# Patient Record
Sex: Female | Born: 1945 | Race: White | Hispanic: No | Marital: Married | Smoking: Never smoker
Health system: Southern US, Community
[De-identification: ages and names within clinical notes are randomized; demographics above are authoritative.]

## PROBLEM LIST (undated history)

## (undated) DIAGNOSIS — Z923 Personal history of irradiation: Secondary | ICD-10-CM

## (undated) DIAGNOSIS — C50919 Malignant neoplasm of unspecified site of unspecified female breast: Secondary | ICD-10-CM

## (undated) DIAGNOSIS — Z9889 Other specified postprocedural states: Secondary | ICD-10-CM

## (undated) DIAGNOSIS — R011 Cardiac murmur, unspecified: Secondary | ICD-10-CM

## (undated) DIAGNOSIS — K219 Gastro-esophageal reflux disease without esophagitis: Secondary | ICD-10-CM

## (undated) DIAGNOSIS — M8580 Other specified disorders of bone density and structure, unspecified site: Secondary | ICD-10-CM

## (undated) DIAGNOSIS — M1990 Unspecified osteoarthritis, unspecified site: Secondary | ICD-10-CM

## (undated) DIAGNOSIS — IMO0001 Reserved for inherently not codable concepts without codable children: Secondary | ICD-10-CM

## (undated) DIAGNOSIS — R112 Nausea with vomiting, unspecified: Secondary | ICD-10-CM

## (undated) DIAGNOSIS — E785 Hyperlipidemia, unspecified: Secondary | ICD-10-CM

## (undated) HISTORY — DX: Unspecified osteoarthritis, unspecified site: M19.90

## (undated) HISTORY — DX: Hyperlipidemia, unspecified: E78.5

## (undated) HISTORY — PX: BREAST BIOPSY: SHX20

## (undated) HISTORY — DX: Cardiac murmur, unspecified: R01.1

## (undated) HISTORY — DX: Reserved for inherently not codable concepts without codable children: IMO0001

## (undated) HISTORY — DX: Gastro-esophageal reflux disease without esophagitis: K21.9

## (undated) HISTORY — PX: COLONOSCOPY: SHX174

## (undated) HISTORY — DX: Other specified disorders of bone density and structure, unspecified site: M85.80

## (undated) HISTORY — PX: EYE SURGERY: SHX253

## (undated) HISTORY — PX: ENUCLEATION: SHX628

## (undated) HISTORY — PX: PARTIAL KNEE ARTHROPLASTY: SHX2174

---

## 1994-02-23 HISTORY — PX: BREAST LUMPECTOMY: SHX2

## 1999-11-19 ENCOUNTER — Encounter: Admission: RE | Admit: 1999-11-19 | Discharge: 1999-11-19 | Payer: Self-pay | Admitting: Surgery

## 1999-11-19 ENCOUNTER — Encounter: Payer: Self-pay | Admitting: Surgery

## 2000-01-28 ENCOUNTER — Other Ambulatory Visit: Admission: RE | Admit: 2000-01-28 | Discharge: 2000-01-28 | Payer: Self-pay | Admitting: Gynecology

## 2000-11-22 ENCOUNTER — Encounter: Admission: RE | Admit: 2000-11-22 | Discharge: 2000-11-22 | Payer: Self-pay | Admitting: Surgery

## 2000-11-22 ENCOUNTER — Encounter: Payer: Self-pay | Admitting: Surgery

## 2001-02-03 ENCOUNTER — Other Ambulatory Visit: Admission: RE | Admit: 2001-02-03 | Discharge: 2001-02-03 | Payer: Self-pay | Admitting: Gynecology

## 2001-11-24 ENCOUNTER — Encounter: Payer: Self-pay | Admitting: Surgery

## 2001-11-24 ENCOUNTER — Encounter: Admission: RE | Admit: 2001-11-24 | Discharge: 2001-11-24 | Payer: Self-pay | Admitting: Surgery

## 2002-02-07 ENCOUNTER — Other Ambulatory Visit: Admission: RE | Admit: 2002-02-07 | Discharge: 2002-02-07 | Payer: Self-pay | Admitting: Gynecology

## 2002-12-08 ENCOUNTER — Encounter: Admission: RE | Admit: 2002-12-08 | Discharge: 2002-12-08 | Payer: Self-pay | Admitting: Surgery

## 2002-12-08 ENCOUNTER — Encounter: Payer: Self-pay | Admitting: Surgery

## 2003-03-01 ENCOUNTER — Other Ambulatory Visit: Admission: RE | Admit: 2003-03-01 | Discharge: 2003-03-01 | Payer: Self-pay | Admitting: Gynecology

## 2003-12-10 ENCOUNTER — Encounter: Admission: RE | Admit: 2003-12-10 | Discharge: 2003-12-10 | Payer: Self-pay | Admitting: Surgery

## 2004-03-13 ENCOUNTER — Other Ambulatory Visit: Admission: RE | Admit: 2004-03-13 | Discharge: 2004-03-13 | Payer: Self-pay | Admitting: Gynecology

## 2004-12-30 ENCOUNTER — Encounter: Admission: RE | Admit: 2004-12-30 | Discharge: 2004-12-30 | Payer: Self-pay | Admitting: Surgery

## 2005-01-12 ENCOUNTER — Encounter (INDEPENDENT_AMBULATORY_CARE_PROVIDER_SITE_OTHER): Payer: Self-pay | Admitting: *Deleted

## 2005-01-12 ENCOUNTER — Encounter: Admission: RE | Admit: 2005-01-12 | Discharge: 2005-01-12 | Payer: Self-pay | Admitting: Surgery

## 2005-01-16 ENCOUNTER — Encounter: Admission: RE | Admit: 2005-01-16 | Discharge: 2005-01-16 | Payer: Self-pay | Admitting: Surgery

## 2005-01-20 ENCOUNTER — Encounter: Admission: RE | Admit: 2005-01-20 | Discharge: 2005-01-20 | Payer: Self-pay | Admitting: Surgery

## 2005-05-07 ENCOUNTER — Ambulatory Visit: Payer: Self-pay | Admitting: Internal Medicine

## 2005-05-21 ENCOUNTER — Ambulatory Visit: Payer: Self-pay | Admitting: Internal Medicine

## 2005-09-08 ENCOUNTER — Encounter: Admission: RE | Admit: 2005-09-08 | Discharge: 2005-09-08 | Payer: Self-pay | Admitting: Surgery

## 2005-12-31 ENCOUNTER — Encounter: Admission: RE | Admit: 2005-12-31 | Discharge: 2005-12-31 | Payer: Self-pay | Admitting: Surgery

## 2006-05-05 ENCOUNTER — Other Ambulatory Visit: Admission: RE | Admit: 2006-05-05 | Discharge: 2006-05-05 | Payer: Self-pay | Admitting: Gynecology

## 2006-08-31 ENCOUNTER — Encounter: Admission: RE | Admit: 2006-08-31 | Discharge: 2006-08-31 | Payer: Self-pay | Admitting: Surgery

## 2006-08-31 ENCOUNTER — Encounter (INDEPENDENT_AMBULATORY_CARE_PROVIDER_SITE_OTHER): Payer: Self-pay | Admitting: Diagnostic Radiology

## 2007-05-16 ENCOUNTER — Encounter: Admission: RE | Admit: 2007-05-16 | Discharge: 2007-05-16 | Payer: Self-pay | Admitting: Gynecology

## 2007-09-01 ENCOUNTER — Encounter: Admission: RE | Admit: 2007-09-01 | Discharge: 2007-09-01 | Payer: Self-pay | Admitting: Surgery

## 2007-09-08 ENCOUNTER — Encounter (INDEPENDENT_AMBULATORY_CARE_PROVIDER_SITE_OTHER): Payer: Self-pay | Admitting: Diagnostic Radiology

## 2007-09-08 ENCOUNTER — Encounter: Admission: RE | Admit: 2007-09-08 | Discharge: 2007-09-08 | Payer: Self-pay | Admitting: Surgery

## 2008-09-10 ENCOUNTER — Encounter: Admission: RE | Admit: 2008-09-10 | Discharge: 2008-09-10 | Payer: Self-pay | Admitting: Surgery

## 2009-02-23 HISTORY — PX: KNEE ARTHROSCOPY: SHX127

## 2009-02-28 ENCOUNTER — Observation Stay (HOSPITAL_COMMUNITY): Admission: RE | Admit: 2009-02-28 | Discharge: 2009-03-01 | Payer: Self-pay | Admitting: Orthopedic Surgery

## 2009-09-12 ENCOUNTER — Encounter: Admission: RE | Admit: 2009-09-12 | Discharge: 2009-09-12 | Payer: Self-pay | Admitting: Surgery

## 2010-07-09 ENCOUNTER — Other Ambulatory Visit: Payer: Self-pay | Admitting: Gynecology

## 2010-08-21 ENCOUNTER — Other Ambulatory Visit (INDEPENDENT_AMBULATORY_CARE_PROVIDER_SITE_OTHER): Payer: Self-pay | Admitting: Surgery

## 2010-08-21 DIAGNOSIS — Z1231 Encounter for screening mammogram for malignant neoplasm of breast: Secondary | ICD-10-CM

## 2010-09-15 ENCOUNTER — Ambulatory Visit
Admission: RE | Admit: 2010-09-15 | Discharge: 2010-09-15 | Disposition: A | Discharge status: Home / Self Care | Payer: Medicare Other | Source: Ambulatory Visit | Attending: Surgery | Admitting: Surgery

## 2010-09-15 DIAGNOSIS — Z1231 Encounter for screening mammogram for malignant neoplasm of breast: Secondary | ICD-10-CM

## 2011-07-21 DIAGNOSIS — E7800 Pure hypercholesterolemia, unspecified: Secondary | ICD-10-CM | POA: Diagnosis not present

## 2011-07-21 DIAGNOSIS — Z01419 Encounter for gynecological examination (general) (routine) without abnormal findings: Secondary | ICD-10-CM | POA: Diagnosis not present

## 2011-07-21 DIAGNOSIS — R7989 Other specified abnormal findings of blood chemistry: Secondary | ICD-10-CM | POA: Diagnosis not present

## 2011-08-24 ENCOUNTER — Other Ambulatory Visit (INDEPENDENT_AMBULATORY_CARE_PROVIDER_SITE_OTHER): Payer: Self-pay | Admitting: Surgery

## 2011-08-24 DIAGNOSIS — Z1231 Encounter for screening mammogram for malignant neoplasm of breast: Secondary | ICD-10-CM

## 2011-08-24 DIAGNOSIS — M25569 Pain in unspecified knee: Secondary | ICD-10-CM | POA: Diagnosis not present

## 2011-09-17 ENCOUNTER — Ambulatory Visit: Payer: Medicare Other

## 2011-09-25 ENCOUNTER — Ambulatory Visit: Payer: Medicare Other

## 2011-10-13 ENCOUNTER — Ambulatory Visit
Admission: RE | Admit: 2011-10-13 | Discharge: 2011-10-13 | Disposition: A | Discharge status: Home / Self Care | Payer: Medicare Other | Source: Ambulatory Visit | Attending: Surgery | Admitting: Surgery

## 2011-10-13 DIAGNOSIS — Z1231 Encounter for screening mammogram for malignant neoplasm of breast: Secondary | ICD-10-CM

## 2011-11-26 DIAGNOSIS — Z23 Encounter for immunization: Secondary | ICD-10-CM | POA: Diagnosis not present

## 2011-12-25 DIAGNOSIS — Z9001 Acquired absence of eye: Secondary | ICD-10-CM | POA: Diagnosis not present

## 2011-12-25 DIAGNOSIS — H52209 Unspecified astigmatism, unspecified eye: Secondary | ICD-10-CM | POA: Diagnosis not present

## 2012-04-15 DIAGNOSIS — R7989 Other specified abnormal findings of blood chemistry: Secondary | ICD-10-CM | POA: Diagnosis not present

## 2012-04-15 DIAGNOSIS — E7800 Pure hypercholesterolemia, unspecified: Secondary | ICD-10-CM | POA: Diagnosis not present

## 2012-06-16 DIAGNOSIS — K219 Gastro-esophageal reflux disease without esophagitis: Secondary | ICD-10-CM | POA: Diagnosis not present

## 2012-06-16 DIAGNOSIS — IMO0002 Reserved for concepts with insufficient information to code with codable children: Secondary | ICD-10-CM | POA: Diagnosis not present

## 2012-06-16 DIAGNOSIS — E785 Hyperlipidemia, unspecified: Secondary | ICD-10-CM | POA: Diagnosis not present

## 2012-06-16 DIAGNOSIS — Z1331 Encounter for screening for depression: Secondary | ICD-10-CM | POA: Diagnosis not present

## 2012-09-20 ENCOUNTER — Other Ambulatory Visit: Payer: Self-pay

## 2012-09-20 DIAGNOSIS — Z1231 Encounter for screening mammogram for malignant neoplasm of breast: Secondary | ICD-10-CM

## 2012-10-13 ENCOUNTER — Ambulatory Visit
Admission: RE | Admit: 2012-10-13 | Discharge: 2012-10-13 | Disposition: A | Discharge status: Home / Self Care | Payer: Medicare Other | Source: Ambulatory Visit

## 2012-10-13 DIAGNOSIS — Z1231 Encounter for screening mammogram for malignant neoplasm of breast: Secondary | ICD-10-CM | POA: Diagnosis not present

## 2012-11-07 DIAGNOSIS — Z23 Encounter for immunization: Secondary | ICD-10-CM | POA: Diagnosis not present

## 2012-12-13 ENCOUNTER — Encounter: Payer: Self-pay | Admitting: Podiatry

## 2012-12-13 ENCOUNTER — Ambulatory Visit (INDEPENDENT_AMBULATORY_CARE_PROVIDER_SITE_OTHER): Payer: Medicare Other | Admitting: Podiatry

## 2012-12-13 ENCOUNTER — Ambulatory Visit (INDEPENDENT_AMBULATORY_CARE_PROVIDER_SITE_OTHER): Payer: Medicare Other

## 2012-12-13 DIAGNOSIS — M2041 Other hammer toe(s) (acquired), right foot: Secondary | ICD-10-CM

## 2012-12-13 DIAGNOSIS — M2040 Other hammer toe(s) (acquired), unspecified foot: Secondary | ICD-10-CM

## 2012-12-13 DIAGNOSIS — M19079 Primary osteoarthritis, unspecified ankle and foot: Secondary | ICD-10-CM

## 2012-12-13 DIAGNOSIS — M19071 Primary osteoarthritis, right ankle and foot: Secondary | ICD-10-CM

## 2012-12-13 NOTE — Progress Notes (Signed)
N-aches L-2nd toe right D-6 mos. O-gradual C-hammer toe, callused area medial side, worse A-shoes T-padding toe

## 2012-12-13 NOTE — Progress Notes (Signed)
Vicki Grant presents today as a 67 year old white female concerned about her second digit of her right foot. States that over the past 6 months she has developed a sore on the medial aspect of the second toe right foot. She seemed to aggravate it she tried padding the toe.  Objective: I have reviewed her past medical history medications and allergies. Vital signs are stable she is alert oriented x3. Vascular evaluation demonstrates strong palpable pulses bilateral no other sensorium is intact bilateral. Deep tendon reflexes are intact bilateral muscle strength is 5 over 5 dorsiflexors plantar flexors inverters and evertors. Orthopedic evaluation demonstrates hammertoe deformity. Contracture deformities at the PIPJ in the DIPJ the second digit right. A mild hallux abductovalgus deformity is resulting in a close approximation of the second toe and the lateral hallux resulting in a superficial ulceration overlying the DIPJ second toe. Radiographically evaluation confirms medial deviation of the second toe and osteoarthritis within it.  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 Mild early skin breakdown medial DIPJ right.  Plan: We discussed etiology pathology conservative versus surgical therapies. At this point she has decided against surgical correction of the second toe right. We did placed padding with silicone pads and all followup with her on an as-needed basis.

## 2012-12-29 ENCOUNTER — Other Ambulatory Visit: Payer: Self-pay

## 2012-12-30 DIAGNOSIS — Z9001 Acquired absence of eye: Secondary | ICD-10-CM | POA: Diagnosis not present

## 2012-12-30 DIAGNOSIS — H5200 Hypermetropia, unspecified eye: Secondary | ICD-10-CM | POA: Diagnosis not present

## 2013-04-13 DIAGNOSIS — R809 Proteinuria, unspecified: Secondary | ICD-10-CM | POA: Diagnosis not present

## 2013-04-13 DIAGNOSIS — E785 Hyperlipidemia, unspecified: Secondary | ICD-10-CM | POA: Diagnosis not present

## 2013-04-25 DIAGNOSIS — Z23 Encounter for immunization: Secondary | ICD-10-CM | POA: Diagnosis not present

## 2013-04-25 DIAGNOSIS — Z0000 Encounter for general adult medical examination without abnormal findings: Secondary | ICD-10-CM | POA: Diagnosis not present

## 2013-04-25 DIAGNOSIS — E785 Hyperlipidemia, unspecified: Secondary | ICD-10-CM | POA: Diagnosis not present

## 2013-04-25 DIAGNOSIS — C50919 Malignant neoplasm of unspecified site of unspecified female breast: Secondary | ICD-10-CM | POA: Diagnosis not present

## 2013-04-25 DIAGNOSIS — IMO0002 Reserved for concepts with insufficient information to code with codable children: Secondary | ICD-10-CM | POA: Diagnosis not present

## 2013-04-25 DIAGNOSIS — K219 Gastro-esophageal reflux disease without esophagitis: Secondary | ICD-10-CM | POA: Diagnosis not present

## 2013-04-28 DIAGNOSIS — Z1212 Encounter for screening for malignant neoplasm of rectum: Secondary | ICD-10-CM | POA: Diagnosis not present

## 2013-05-03 DIAGNOSIS — Z0000 Encounter for general adult medical examination without abnormal findings: Secondary | ICD-10-CM | POA: Diagnosis not present

## 2013-05-09 DIAGNOSIS — Z780 Asymptomatic menopausal state: Secondary | ICD-10-CM | POA: Diagnosis not present

## 2013-05-09 DIAGNOSIS — Z79899 Other long term (current) drug therapy: Secondary | ICD-10-CM | POA: Diagnosis not present

## 2013-06-22 DIAGNOSIS — D179 Benign lipomatous neoplasm, unspecified: Secondary | ICD-10-CM | POA: Diagnosis not present

## 2013-06-22 DIAGNOSIS — G542 Cervical root disorders, not elsewhere classified: Secondary | ICD-10-CM | POA: Diagnosis not present

## 2013-06-22 DIAGNOSIS — IMO0002 Reserved for concepts with insufficient information to code with codable children: Secondary | ICD-10-CM | POA: Diagnosis not present

## 2013-06-23 ENCOUNTER — Encounter (INDEPENDENT_AMBULATORY_CARE_PROVIDER_SITE_OTHER): Payer: Self-pay | Admitting: Surgery

## 2013-06-23 ENCOUNTER — Ambulatory Visit (INDEPENDENT_AMBULATORY_CARE_PROVIDER_SITE_OTHER): Payer: Medicare Other | Admitting: Surgery

## 2013-06-23 VITALS — BP 122/72 | HR 74 | Temp 98.4°F | Resp 16 | Ht 61.0 in | Wt 119.4 lb

## 2013-06-23 DIAGNOSIS — D1739 Benign lipomatous neoplasm of skin and subcutaneous tissue of other sites: Secondary | ICD-10-CM

## 2013-06-23 DIAGNOSIS — D1720 Benign lipomatous neoplasm of skin and subcutaneous tissue of unspecified limb: Secondary | ICD-10-CM | POA: Insufficient documentation

## 2013-06-23 NOTE — Progress Notes (Signed)
Patient ID: Vicki Grant, female   DOB: 10-26-1945, 68 y.o.   MRN: [id number]  Chief Complaint  Patient presents with  . eval trapezial mass    HPI Vicki Grant is a 68 y.o. female.  Pt presents at the request of [name], MD For mass over left trapezius muscle for three years.  It is getting larger.  It is tender.  It is mobile. Massage therapy has not helped.  HPI  Past Medical History  Diagnosis Date  . OA (osteoarthritis)   . Osteopenia   . Reflux     Past Surgical History  Procedure Laterality Date  . Breast lumpectomy    . Enucleation    . Knee arthroscopy      Family History  Problem Relation Age of Onset  . Cancer Father     lung    Social History History  Substance Use Topics  . Smoking status: Never Smoker   . Smokeless tobacco: Not on file  . Alcohol Use: No    Allergies  Allergen Reactions  . Codeine   . Darvocet [Propoxyphene N-Acetaminophen]   . Other     Certain general anesthesia makes her nauseated  . Penicillins     Current Outpatient Prescriptions  Medication Sig Dispense Refill  . Ascorbic Acid (VITAMIN C ER PO) Take by mouth.      Marland Kitchen aspirin 325 MG tablet Take 325 mg by mouth daily.      Marland Kitchen atorvastatin (LIPITOR) 20 MG tablet       . calcium carbonate 200 MG capsule Take 250 mg by mouth 2 (two) times daily with a meal.      . celecoxib (CELEBREX) 200 MG capsule Take 200 mg by mouth 2 (two) times daily.      . Cholecalciferol (VITAMIN D PO) Take by mouth.      . diazepam (VALIUM) 5 MG tablet       . methocarbamol (ROBAXIN) 500 MG tablet       . Multiple Vitamin (MULTIVITAMIN) capsule Take 1 capsule by mouth daily.      . niacin (NIASPAN) 500 MG CR tablet Take 500 mg by mouth at bedtime.      . Omega-3-acid Ethyl Esters (LOVAZA PO) Take by mouth.      Marland Kitchen omeprazole (PRILOSEC) 20 MG capsule       . traMADol (ULTRAM) 50 MG tablet       . VITAMIN E EX Apply topically.       No current facility-administered medications for this  visit.    Review of Systems Review of Systems  Constitutional: Negative for fever, chills and unexpected weight change.  HENT: Negative for congestion, hearing loss, sore throat, trouble swallowing and voice change.   Eyes: Negative for visual disturbance.  Respiratory: Negative for cough and wheezing.   Cardiovascular: Negative for chest pain, palpitations and leg swelling.  Gastrointestinal: Negative for nausea, vomiting, abdominal pain, diarrhea, constipation, blood in stool, abdominal distention and anal bleeding.  Genitourinary: Negative for hematuria, vaginal bleeding and difficulty urinating.  Musculoskeletal: Negative for arthralgias.  Skin: Negative for rash and wound.  Neurological: Negative for seizures, syncope and headaches.  Hematological: Negative for adenopathy. Does not bruise/bleed easily.  Psychiatric/Behavioral: Negative for confusion.    Blood pressure 122/72, pulse 74, temperature 98.4 F (36.9 C), resp. rate 16, height 5\' 1"  (1.549 m), weight 119 lb 6.4 oz (54.159 kg).  Physical Exam Physical Exam  Constitutional: She is oriented to person, place, and time.  She appears well-developed and well-nourished.  Cardiovascular: Normal rate and regular rhythm.   Pulmonary/Chest: Effort normal and breath sounds normal.  Musculoskeletal: Normal range of motion.  Neurological: She is alert and oriented to person, place, and time.  Skin: Skin is warm and dry.     Psychiatric: She has a normal mood and affect. Her behavior is normal. Judgment and thought content normal.    Data Reviewed none  Assessment    Mass  Left shoulder 3 cm  X 3 cm     Plan    Recommend excision of probable lipoma. The procedure has been discussed with the patient.  Alternative therapies have been discussed with the patient.  Operative risks include bleeding,  Infection,  Organ injury,  Nerve injury,  Blood vessel injury,  DVT,  Pulmonary embolism,  Death,  And possible reoperation.   Medical management risks include worsening of present situation.  The success of the procedure is 50 -90 % at treating patients symptoms.  The patient understands and agrees to proceed.       Darrelle Wiberg A. Talib Headley 06/23/2013, 12:30 PM

## 2013-06-23 NOTE — Patient Instructions (Signed)

## 2013-07-06 ENCOUNTER — Other Ambulatory Visit (INDEPENDENT_AMBULATORY_CARE_PROVIDER_SITE_OTHER): Payer: Self-pay | Admitting: Surgery

## 2013-07-06 DIAGNOSIS — D1739 Benign lipomatous neoplasm of skin and subcutaneous tissue of other sites: Secondary | ICD-10-CM

## 2013-07-10 ENCOUNTER — Other Ambulatory Visit (INDEPENDENT_AMBULATORY_CARE_PROVIDER_SITE_OTHER): Payer: Self-pay

## 2013-07-10 MED ORDER — HYDROCODONE-ACETAMINOPHEN 5-325 MG PO TABS: 1.0000 | ORAL_TABLET | Freq: Four times a day (QID) | ORAL | Status: DC | PRN

## 2013-07-21 ENCOUNTER — Encounter (INDEPENDENT_AMBULATORY_CARE_PROVIDER_SITE_OTHER): Payer: Self-pay | Admitting: Surgery

## 2013-07-21 ENCOUNTER — Ambulatory Visit (INDEPENDENT_AMBULATORY_CARE_PROVIDER_SITE_OTHER): Payer: Medicare Other | Admitting: Surgery

## 2013-07-21 VITALS — BP 160/72 | HR 80 | Temp 98.0°F | Resp 16 | Ht 61.0 in | Wt 121.4 lb

## 2013-07-21 DIAGNOSIS — Z9889 Other specified postprocedural states: Secondary | ICD-10-CM

## 2013-07-21 NOTE — Patient Instructions (Signed)
Resume full activity.  No restrictions.

## 2013-07-21 NOTE — Progress Notes (Signed)
Patient returns after excision of left shoulder lipoma. She is 2 weeks out and doing well. She has some mild tightness.  Exam: Left shoulder incision clean dry and intact without signs of infection  Impression: Status post excision left shoulder lipoma  Plan: Return to full activity. Followup as needed.

## 2013-09-19 ENCOUNTER — Other Ambulatory Visit: Payer: Self-pay

## 2013-09-19 DIAGNOSIS — Z1231 Encounter for screening mammogram for malignant neoplasm of breast: Secondary | ICD-10-CM

## 2013-10-16 ENCOUNTER — Ambulatory Visit
Admission: RE | Admit: 2013-10-16 | Discharge: 2013-10-16 | Disposition: A | Discharge status: Home / Self Care | Payer: Medicare Other | Source: Ambulatory Visit

## 2013-10-16 DIAGNOSIS — Z1231 Encounter for screening mammogram for malignant neoplasm of breast: Secondary | ICD-10-CM

## 2013-10-18 ENCOUNTER — Other Ambulatory Visit: Payer: Self-pay | Admitting: Internal Medicine

## 2013-10-18 DIAGNOSIS — R928 Other abnormal and inconclusive findings on diagnostic imaging of breast: Secondary | ICD-10-CM

## 2013-10-20 ENCOUNTER — Ambulatory Visit
Admission: RE | Admit: 2013-10-20 | Discharge: 2013-10-20 | Disposition: A | Discharge status: Home / Self Care | Payer: Medicare Other | Source: Ambulatory Visit | Attending: Internal Medicine | Admitting: Internal Medicine

## 2013-10-20 DIAGNOSIS — R928 Other abnormal and inconclusive findings on diagnostic imaging of breast: Secondary | ICD-10-CM | POA: Diagnosis not present

## 2013-11-21 DIAGNOSIS — Z23 Encounter for immunization: Secondary | ICD-10-CM | POA: Diagnosis not present

## 2013-12-27 DIAGNOSIS — M25512 Pain in left shoulder: Secondary | ICD-10-CM | POA: Diagnosis not present

## 2013-12-27 DIAGNOSIS — Z01419 Encounter for gynecological examination (general) (routine) without abnormal findings: Secondary | ICD-10-CM | POA: Diagnosis not present

## 2013-12-27 DIAGNOSIS — N951 Menopausal and female climacteric states: Secondary | ICD-10-CM | POA: Diagnosis not present

## 2013-12-27 DIAGNOSIS — Z853 Personal history of malignant neoplasm of breast: Secondary | ICD-10-CM | POA: Diagnosis not present

## 2014-01-01 DIAGNOSIS — H52202 Unspecified astigmatism, left eye: Secondary | ICD-10-CM | POA: Diagnosis not present

## 2014-01-01 DIAGNOSIS — Z9001 Acquired absence of eye: Secondary | ICD-10-CM | POA: Diagnosis not present

## 2014-01-01 DIAGNOSIS — H2512 Age-related nuclear cataract, left eye: Secondary | ICD-10-CM | POA: Diagnosis not present

## 2014-03-09 DIAGNOSIS — M25561 Pain in right knee: Secondary | ICD-10-CM | POA: Diagnosis not present

## 2014-04-20 DIAGNOSIS — E785 Hyperlipidemia, unspecified: Secondary | ICD-10-CM | POA: Diagnosis not present

## 2014-04-20 DIAGNOSIS — Z79899 Other long term (current) drug therapy: Secondary | ICD-10-CM | POA: Diagnosis not present

## 2014-04-27 DIAGNOSIS — E785 Hyperlipidemia, unspecified: Secondary | ICD-10-CM | POA: Diagnosis not present

## 2014-04-27 DIAGNOSIS — Z1389 Encounter for screening for other disorder: Secondary | ICD-10-CM | POA: Diagnosis not present

## 2014-04-27 DIAGNOSIS — Z6823 Body mass index (BMI) 23.0-23.9, adult: Secondary | ICD-10-CM | POA: Diagnosis not present

## 2014-04-27 DIAGNOSIS — M859 Disorder of bone density and structure, unspecified: Secondary | ICD-10-CM | POA: Diagnosis not present

## 2014-04-27 DIAGNOSIS — Z0000 Encounter for general adult medical examination without abnormal findings: Secondary | ICD-10-CM | POA: Diagnosis not present

## 2014-04-27 DIAGNOSIS — Z23 Encounter for immunization: Secondary | ICD-10-CM | POA: Diagnosis not present

## 2014-04-27 DIAGNOSIS — E781 Pure hyperglyceridemia: Secondary | ICD-10-CM | POA: Diagnosis not present

## 2014-04-27 DIAGNOSIS — M5412 Radiculopathy, cervical region: Secondary | ICD-10-CM | POA: Diagnosis not present

## 2014-04-27 DIAGNOSIS — K219 Gastro-esophageal reflux disease without esophagitis: Secondary | ICD-10-CM | POA: Diagnosis not present

## 2014-05-09 DIAGNOSIS — Z1212 Encounter for screening for malignant neoplasm of rectum: Secondary | ICD-10-CM | POA: Diagnosis not present

## 2014-09-26 ENCOUNTER — Other Ambulatory Visit: Payer: Self-pay

## 2014-09-26 DIAGNOSIS — Z1231 Encounter for screening mammogram for malignant neoplasm of breast: Secondary | ICD-10-CM

## 2014-11-06 ENCOUNTER — Ambulatory Visit: Payer: Medicare Other

## 2014-11-19 ENCOUNTER — Ambulatory Visit
Admission: RE | Admit: 2014-11-19 | Discharge: 2014-11-19 | Disposition: A | Discharge status: Home / Self Care | Payer: Medicare Other | Source: Ambulatory Visit

## 2014-11-19 DIAGNOSIS — Z1231 Encounter for screening mammogram for malignant neoplasm of breast: Secondary | ICD-10-CM | POA: Diagnosis not present

## 2014-11-26 DIAGNOSIS — Z23 Encounter for immunization: Secondary | ICD-10-CM | POA: Diagnosis not present

## 2015-01-07 DIAGNOSIS — H2512 Age-related nuclear cataract, left eye: Secondary | ICD-10-CM | POA: Diagnosis not present

## 2015-01-07 DIAGNOSIS — H52202 Unspecified astigmatism, left eye: Secondary | ICD-10-CM | POA: Diagnosis not present

## 2015-01-07 DIAGNOSIS — Z970 Presence of artificial eye: Secondary | ICD-10-CM | POA: Diagnosis not present

## 2015-01-31 DIAGNOSIS — J0100 Acute maxillary sinusitis, unspecified: Secondary | ICD-10-CM | POA: Diagnosis not present

## 2015-01-31 DIAGNOSIS — Z6823 Body mass index (BMI) 23.0-23.9, adult: Secondary | ICD-10-CM | POA: Diagnosis not present

## 2015-04-22 DIAGNOSIS — E784 Other hyperlipidemia: Secondary | ICD-10-CM | POA: Diagnosis not present

## 2015-04-22 DIAGNOSIS — M859 Disorder of bone density and structure, unspecified: Secondary | ICD-10-CM | POA: Diagnosis not present

## 2015-04-24 ENCOUNTER — Encounter: Payer: Self-pay | Admitting: Gastroenterology

## 2015-04-29 DIAGNOSIS — C50919 Malignant neoplasm of unspecified site of unspecified female breast: Secondary | ICD-10-CM | POA: Diagnosis not present

## 2015-04-29 DIAGNOSIS — M1990 Unspecified osteoarthritis, unspecified site: Secondary | ICD-10-CM | POA: Diagnosis not present

## 2015-04-29 DIAGNOSIS — K219 Gastro-esophageal reflux disease without esophagitis: Secondary | ICD-10-CM | POA: Diagnosis not present

## 2015-04-29 DIAGNOSIS — E784 Other hyperlipidemia: Secondary | ICD-10-CM | POA: Diagnosis not present

## 2015-04-29 DIAGNOSIS — R740 Nonspecific elevation of levels of transaminase and lactic acid dehydrogenase [LDH]: Secondary | ICD-10-CM | POA: Diagnosis not present

## 2015-04-29 DIAGNOSIS — M859 Disorder of bone density and structure, unspecified: Secondary | ICD-10-CM | POA: Diagnosis not present

## 2015-04-29 DIAGNOSIS — Z6823 Body mass index (BMI) 23.0-23.9, adult: Secondary | ICD-10-CM | POA: Diagnosis not present

## 2015-04-29 DIAGNOSIS — Z1389 Encounter for screening for other disorder: Secondary | ICD-10-CM | POA: Diagnosis not present

## 2015-04-29 DIAGNOSIS — Z0000 Encounter for general adult medical examination without abnormal findings: Secondary | ICD-10-CM | POA: Diagnosis not present

## 2015-04-29 DIAGNOSIS — H6121 Impacted cerumen, right ear: Secondary | ICD-10-CM | POA: Diagnosis not present

## 2015-04-29 DIAGNOSIS — G4700 Insomnia, unspecified: Secondary | ICD-10-CM | POA: Diagnosis not present

## 2015-04-29 DIAGNOSIS — E781 Pure hyperglyceridemia: Secondary | ICD-10-CM | POA: Diagnosis not present

## 2015-04-30 ENCOUNTER — Encounter: Payer: Self-pay | Admitting: Gastroenterology

## 2015-05-08 DIAGNOSIS — Z1212 Encounter for screening for malignant neoplasm of rectum: Secondary | ICD-10-CM | POA: Diagnosis not present

## 2015-05-21 DIAGNOSIS — H6123 Impacted cerumen, bilateral: Secondary | ICD-10-CM | POA: Diagnosis not present

## 2015-06-04 DIAGNOSIS — L2089 Other atopic dermatitis: Secondary | ICD-10-CM | POA: Diagnosis not present

## 2015-06-04 DIAGNOSIS — L821 Other seborrheic keratosis: Secondary | ICD-10-CM | POA: Diagnosis not present

## 2015-07-04 DIAGNOSIS — M859 Disorder of bone density and structure, unspecified: Secondary | ICD-10-CM | POA: Diagnosis not present

## 2015-07-04 DIAGNOSIS — R740 Nonspecific elevation of levels of transaminase and lactic acid dehydrogenase [LDH]: Secondary | ICD-10-CM | POA: Diagnosis not present

## 2015-07-05 ENCOUNTER — Ambulatory Visit (AMBULATORY_SURGERY_CENTER): Payer: Self-pay

## 2015-07-05 VITALS — Ht 61.0 in | Wt 120.8 lb

## 2015-07-05 DIAGNOSIS — Z1211 Encounter for screening for malignant neoplasm of colon: Secondary | ICD-10-CM

## 2015-07-05 NOTE — Progress Notes (Signed)
No allergic to eggs or soy No past problems with anesthesia except PONV No diet meds No home oxygen  Has email and internet; declined emmi

## 2015-07-19 ENCOUNTER — Ambulatory Visit (AMBULATORY_SURGERY_CENTER): Payer: Medicare Other | Admitting: Gastroenterology

## 2015-07-19 ENCOUNTER — Encounter: Payer: Self-pay | Admitting: Gastroenterology

## 2015-07-19 VITALS — BP 110/46 | HR 66 | Temp 97.5°F | Resp 20 | Ht 61.0 in | Wt 120.0 lb

## 2015-07-19 DIAGNOSIS — K219 Gastro-esophageal reflux disease without esophagitis: Secondary | ICD-10-CM | POA: Diagnosis not present

## 2015-07-19 DIAGNOSIS — K635 Polyp of colon: Secondary | ICD-10-CM | POA: Diagnosis not present

## 2015-07-19 DIAGNOSIS — Z1211 Encounter for screening for malignant neoplasm of colon: Secondary | ICD-10-CM

## 2015-07-19 DIAGNOSIS — D122 Benign neoplasm of ascending colon: Secondary | ICD-10-CM

## 2015-07-19 MED ORDER — SODIUM CHLORIDE 0.9 % IV SOLN: 500.0000 mL | INTRAVENOUS | Status: DC

## 2015-07-19 NOTE — Progress Notes (Signed)
Called to room to assist during endoscopic procedure.  Patient ID and intended procedure confirmed with present staff. Received instructions for my participation in the procedure from the performing physician.  

## 2015-07-19 NOTE — Op Note (Signed)
[facility] Patient Name: Vicki Grant Procedure Date: 07/19/2015 10:01 AM MRN: [id number] Endoscopist: [place]. Loletha Carrow , MD Age: 70 Referring MD:  Date of Birth: 1945-05-03 Gender: Female Procedure:                Colonoscopy Indications:              Screening for colorectal malignant neoplasm Medicines:                Monitored Anesthesia Care Procedure:                Pre-Anesthesia Assessment:                           - Prior to the procedure, a History and Physical                            was performed, and patient medications and                            allergies were reviewed. The patient's tolerance of                            previous anesthesia was also reviewed. The risks                            and benefits of the procedure and the sedation                            options and risks were discussed with the patient.                            All questions were answered, and informed consent                            was obtained. Prior Anticoagulants: The patient has                            taken no previous anticoagulant or antiplatelet                            agents. ASA Grade Assessment: II - A patient with                            mild systemic disease. After reviewing the risks                            and benefits, the patient was deemed in                            satisfactory condition to undergo the procedure.                           After obtaining informed consent, the colonoscope  was passed under direct vision. Throughout the                            procedure, the patient's blood pressure, pulse, and                            oxygen saturations were monitored continuously. The                            Model CF-HQ190L [id number]) scope was introduced                            through the anus and advanced to the the cecum,                            identified by appendiceal orifice and  ileocecal                            valve. The colonoscopy was performed without                            difficulty. The patient tolerated the procedure                            well. The quality of the bowel preparation was                            good. The ileocecal valve, appendiceal orifice, and                            rectum were photographed. The bowel preparation                            used was Miralax. Scope In: 10:13:37 AM Scope Out: 10:29:16 AM Scope Withdrawal Time: 0 hours 10 minutes 27 seconds  Total Procedure Duration: 0 hours 15 minutes 39 seconds  Findings:                 The perianal and digital rectal examinations were                            normal.                           Two sessile polyps were found in the ascending                            colon. The polyps were 2 mm in size. These polyps                            were removed with a piecemeal technique using a                            cold biopsy forceps. Resection and retrieval were  complete.                           Multiple small-mouthed diverticula were found in                            the left colon.                           Internal hemorrhoids were found during                            retroflexion. The hemorrhoids were Grade I                            (internal hemorrhoids that do not prolapse).                           The exam was otherwise without abnormality. Complications:            No immediate complications. Estimated Blood Loss:     Estimated blood loss: none. Impression:               - Two 2 mm polyps in the ascending colon, removed                            piecemeal using a cold biopsy forceps. Resected and                            retrieved.                           - Diverticulosis in the left colon.                           - Internal hemorrhoids.                           - The examination was otherwise  normal. Recommendation:           - Patient has a contact number available for                            emergencies. The signs and symptoms of potential                            delayed complications were discussed with the                            patient. Return to normal activities tomorrow.                            Written discharge instructions were provided to the                            patient.                           -  Resume previous diet.                           - Continue present medications.                           - Await pathology results.                           - Repeat colonoscopy is recommended for                            surveillance. The colonoscopy date will be                            determined after pathology results from today's                            exam become available for review. Aubrie Lucien L. Loletha Carrow, MD 07/19/2015 10:34:07 AM This report has been signed electronically.

## 2015-07-19 NOTE — Progress Notes (Signed)
Stable to RR 

## 2015-07-19 NOTE — Patient Instructions (Signed)
YOU HAD AN ENDOSCOPIC PROCEDURE TODAY AT [facility]:   Refer to the procedure report that was given to you for any specific questions about what was found during the examination.  If the procedure report does not answer your questions, please call your gastroenterologist to clarify.  If you requested that your care partner not be given the details of your procedure findings, then the procedure report has been included in a sealed envelope for you to review at your convenience later.  YOU SHOULD EXPECT: Some feelings of bloating in the abdomen. Passage of more gas than usual.  Walking can help get rid of the air that was put into your GI tract during the procedure and reduce the bloating. If you had a lower endoscopy (such as a colonoscopy or flexible sigmoidoscopy) you may notice spotting of blood in your stool or on the toilet paper. If you underwent a bowel prep for your procedure, you may not have a normal bowel movement for a few days.  Please Note:  You might notice some irritation and congestion in your nose or some drainage.  This is from the oxygen used during your procedure.  There is no need for concern and it should clear up in a day or so.  SYMPTOMS TO REPORT IMMEDIATELY:   Following lower endoscopy (colonoscopy or flexible sigmoidoscopy):  Excessive amounts of blood in the stool  Significant tenderness or worsening of abdominal pains  Swelling of the abdomen that is new, acute  Fever of 100F or higher   For urgent or emergent issues, a gastroenterologist can be reached at any hour by calling [phone].   DIET: Your first meal following the procedure should be a small meal and then it is ok to progress to your normal diet. Heavy or fried foods are harder to digest and may make you feel nauseous or bloated.  Likewise, meals heavy in dairy and vegetables can increase bloating.  Drink plenty of fluids but you should avoid alcoholic beverages for 24  hours.  ACTIVITY:  You should plan to take it easy for the rest of today and you should NOT DRIVE or use heavy machinery until tomorrow (because of the sedation medicines used during the test).    FOLLOW UP: Our staff will call the number listed on your records the next business day following your procedure to check on you and address any questions or concerns that you may have regarding the information given to you following your procedure. If we do not reach you, we will leave a message.  However, if you are feeling well and you are not experiencing any problems, there is no need to return our call.  We will assume that you have returned to your regular daily activities without incident.  If any biopsies were taken you will be contacted by phone or by letter within the next 1-3 weeks.  Please call us at [phone] if you have not heard about the biopsies in 3 weeks.    SIGNATURES/CONFIDENTIALITY: You and/or your care partner have signed paperwork which will be entered into your electronic medical record.  These signatures attest to the fact that that the information above on your After Visit Summary has been reviewed and is understood.  Full responsibility of the confidentiality of this discharge information lies with you and/or your care-partner.  Polyps, hemorrhoids -handouts given   Repeat colonoscopy will be determined by pathology.

## 2015-07-23 ENCOUNTER — Telehealth: Payer: Self-pay

## 2015-07-23 NOTE — Telephone Encounter (Signed)
  Follow up Call-  Call back number 07/19/2015  Post procedure Call Back phone  # [phone]  Permission to leave phone message Yes     Patient questions:  Do you have a fever, pain , or abdominal swelling? No. Pain Score  0 *  Have you tolerated food without any problems? Yes.    Have you been able to return to your normal activities? Yes.    Do you have any questions about your discharge instructions: Diet   No. Medications  No. Follow up visit  No.  Do you have questions or concerns about your Care? No.  Actions: * If pain score is 4 or above: No action needed, pain <4.

## 2015-07-25 ENCOUNTER — Encounter: Payer: Self-pay | Admitting: Gastroenterology

## 2015-10-21 ENCOUNTER — Other Ambulatory Visit: Payer: Self-pay | Admitting: Internal Medicine

## 2015-10-21 DIAGNOSIS — Z1231 Encounter for screening mammogram for malignant neoplasm of breast: Secondary | ICD-10-CM

## 2015-11-20 ENCOUNTER — Ambulatory Visit
Admission: RE | Admit: 2015-11-20 | Discharge: 2015-11-20 | Disposition: A | Discharge status: Home / Self Care | Payer: Medicare Other | Source: Ambulatory Visit | Attending: Internal Medicine | Admitting: Internal Medicine

## 2015-11-20 DIAGNOSIS — Z1231 Encounter for screening mammogram for malignant neoplasm of breast: Secondary | ICD-10-CM

## 2015-11-28 DIAGNOSIS — Z23 Encounter for immunization: Secondary | ICD-10-CM | POA: Diagnosis not present

## 2016-01-20 DIAGNOSIS — H2512 Age-related nuclear cataract, left eye: Secondary | ICD-10-CM | POA: Diagnosis not present

## 2016-01-20 DIAGNOSIS — Z970 Presence of artificial eye: Secondary | ICD-10-CM | POA: Diagnosis not present

## 2016-01-20 DIAGNOSIS — H52202 Unspecified astigmatism, left eye: Secondary | ICD-10-CM | POA: Diagnosis not present

## 2016-04-24 DIAGNOSIS — E784 Other hyperlipidemia: Secondary | ICD-10-CM | POA: Diagnosis not present

## 2016-04-24 DIAGNOSIS — M859 Disorder of bone density and structure, unspecified: Secondary | ICD-10-CM | POA: Diagnosis not present

## 2016-05-01 DIAGNOSIS — K219 Gastro-esophageal reflux disease without esophagitis: Secondary | ICD-10-CM | POA: Diagnosis not present

## 2016-05-01 DIAGNOSIS — Z6823 Body mass index (BMI) 23.0-23.9, adult: Secondary | ICD-10-CM | POA: Diagnosis not present

## 2016-05-01 DIAGNOSIS — E781 Pure hyperglyceridemia: Secondary | ICD-10-CM | POA: Diagnosis not present

## 2016-05-01 DIAGNOSIS — E784 Other hyperlipidemia: Secondary | ICD-10-CM | POA: Diagnosis not present

## 2016-05-01 DIAGNOSIS — M859 Disorder of bone density and structure, unspecified: Secondary | ICD-10-CM | POA: Diagnosis not present

## 2016-05-01 DIAGNOSIS — M1990 Unspecified osteoarthritis, unspecified site: Secondary | ICD-10-CM | POA: Diagnosis not present

## 2016-05-01 DIAGNOSIS — Z1389 Encounter for screening for other disorder: Secondary | ICD-10-CM | POA: Diagnosis not present

## 2016-05-01 DIAGNOSIS — R740 Nonspecific elevation of levels of transaminase and lactic acid dehydrogenase [LDH]: Secondary | ICD-10-CM | POA: Diagnosis not present

## 2016-05-01 DIAGNOSIS — M792 Neuralgia and neuritis, unspecified: Secondary | ICD-10-CM | POA: Diagnosis not present

## 2016-05-01 DIAGNOSIS — Z0000 Encounter for general adult medical examination without abnormal findings: Secondary | ICD-10-CM | POA: Diagnosis not present

## 2016-05-01 DIAGNOSIS — C50919 Malignant neoplasm of unspecified site of unspecified female breast: Secondary | ICD-10-CM | POA: Diagnosis not present

## 2016-05-07 DIAGNOSIS — Z1212 Encounter for screening for malignant neoplasm of rectum: Secondary | ICD-10-CM | POA: Diagnosis not present

## 2016-10-07 ENCOUNTER — Other Ambulatory Visit: Payer: Self-pay | Admitting: Internal Medicine

## 2016-10-07 DIAGNOSIS — Z1231 Encounter for screening mammogram for malignant neoplasm of breast: Secondary | ICD-10-CM

## 2016-11-11 DIAGNOSIS — Z23 Encounter for immunization: Secondary | ICD-10-CM | POA: Diagnosis not present

## 2016-11-23 ENCOUNTER — Ambulatory Visit
Admission: RE | Admit: 2016-11-23 | Discharge: 2016-11-23 | Disposition: A | Discharge status: Home / Self Care | Payer: Medicare Other | Source: Ambulatory Visit | Attending: Internal Medicine | Admitting: Internal Medicine

## 2016-11-23 DIAGNOSIS — Z1231 Encounter for screening mammogram for malignant neoplasm of breast: Secondary | ICD-10-CM | POA: Diagnosis not present

## 2017-01-25 DIAGNOSIS — H2512 Age-related nuclear cataract, left eye: Secondary | ICD-10-CM | POA: Diagnosis not present

## 2017-01-25 DIAGNOSIS — H5202 Hypermetropia, left eye: Secondary | ICD-10-CM | POA: Diagnosis not present

## 2017-04-27 DIAGNOSIS — M859 Disorder of bone density and structure, unspecified: Secondary | ICD-10-CM | POA: Diagnosis not present

## 2017-04-27 DIAGNOSIS — R82998 Other abnormal findings in urine: Secondary | ICD-10-CM | POA: Diagnosis not present

## 2017-04-27 DIAGNOSIS — E7849 Other hyperlipidemia: Secondary | ICD-10-CM | POA: Diagnosis not present

## 2017-05-04 DIAGNOSIS — G4709 Other insomnia: Secondary | ICD-10-CM | POA: Diagnosis not present

## 2017-05-04 DIAGNOSIS — C50919 Malignant neoplasm of unspecified site of unspecified female breast: Secondary | ICD-10-CM | POA: Diagnosis not present

## 2017-05-04 DIAGNOSIS — K219 Gastro-esophageal reflux disease without esophagitis: Secondary | ICD-10-CM | POA: Diagnosis not present

## 2017-05-04 DIAGNOSIS — Z1389 Encounter for screening for other disorder: Secondary | ICD-10-CM | POA: Diagnosis not present

## 2017-05-04 DIAGNOSIS — H6121 Impacted cerumen, right ear: Secondary | ICD-10-CM | POA: Diagnosis not present

## 2017-05-04 DIAGNOSIS — Z6823 Body mass index (BMI) 23.0-23.9, adult: Secondary | ICD-10-CM | POA: Diagnosis not present

## 2017-05-04 DIAGNOSIS — E781 Pure hyperglyceridemia: Secondary | ICD-10-CM | POA: Diagnosis not present

## 2017-05-04 DIAGNOSIS — Z0000 Encounter for general adult medical examination without abnormal findings: Secondary | ICD-10-CM | POA: Diagnosis not present

## 2017-05-04 DIAGNOSIS — E7849 Other hyperlipidemia: Secondary | ICD-10-CM | POA: Diagnosis not present

## 2017-05-04 DIAGNOSIS — M859 Disorder of bone density and structure, unspecified: Secondary | ICD-10-CM | POA: Diagnosis not present

## 2017-05-12 DIAGNOSIS — Z1212 Encounter for screening for malignant neoplasm of rectum: Secondary | ICD-10-CM | POA: Diagnosis not present

## 2017-10-13 ENCOUNTER — Other Ambulatory Visit: Payer: Self-pay | Admitting: Internal Medicine

## 2017-10-13 DIAGNOSIS — Z1231 Encounter for screening mammogram for malignant neoplasm of breast: Secondary | ICD-10-CM

## 2017-11-09 DIAGNOSIS — Z23 Encounter for immunization: Secondary | ICD-10-CM | POA: Diagnosis not present

## 2017-11-29 ENCOUNTER — Ambulatory Visit
Admission: RE | Admit: 2017-11-29 | Discharge: 2017-11-29 | Disposition: A | Discharge status: Home / Self Care | Payer: Medicare Other | Source: Ambulatory Visit | Attending: Internal Medicine | Admitting: Internal Medicine

## 2017-11-29 DIAGNOSIS — Z1231 Encounter for screening mammogram for malignant neoplasm of breast: Secondary | ICD-10-CM | POA: Diagnosis not present

## 2017-11-29 HISTORY — DX: Malignant neoplasm of unspecified site of unspecified female breast: C50.919

## 2017-11-29 HISTORY — DX: Personal history of irradiation: Z92.3

## 2018-01-26 DIAGNOSIS — Z970 Presence of artificial eye: Secondary | ICD-10-CM | POA: Diagnosis not present

## 2018-01-26 DIAGNOSIS — H2512 Age-related nuclear cataract, left eye: Secondary | ICD-10-CM | POA: Diagnosis not present

## 2018-01-26 DIAGNOSIS — H5202 Hypermetropia, left eye: Secondary | ICD-10-CM | POA: Diagnosis not present

## 2018-05-02 DIAGNOSIS — E7849 Other hyperlipidemia: Secondary | ICD-10-CM | POA: Diagnosis not present

## 2018-05-02 DIAGNOSIS — R82998 Other abnormal findings in urine: Secondary | ICD-10-CM | POA: Diagnosis not present

## 2018-05-02 DIAGNOSIS — M859 Disorder of bone density and structure, unspecified: Secondary | ICD-10-CM | POA: Diagnosis not present

## 2018-05-09 DIAGNOSIS — E7849 Other hyperlipidemia: Secondary | ICD-10-CM | POA: Diagnosis not present

## 2018-05-09 DIAGNOSIS — M859 Disorder of bone density and structure, unspecified: Secondary | ICD-10-CM | POA: Diagnosis not present

## 2018-05-09 DIAGNOSIS — K219 Gastro-esophageal reflux disease without esophagitis: Secondary | ICD-10-CM | POA: Diagnosis not present

## 2018-05-09 DIAGNOSIS — C50919 Malignant neoplasm of unspecified site of unspecified female breast: Secondary | ICD-10-CM | POA: Diagnosis not present

## 2018-05-09 DIAGNOSIS — Z6823 Body mass index (BMI) 23.0-23.9, adult: Secondary | ICD-10-CM | POA: Diagnosis not present

## 2018-05-09 DIAGNOSIS — G4709 Other insomnia: Secondary | ICD-10-CM | POA: Diagnosis not present

## 2018-05-09 DIAGNOSIS — Z0000 Encounter for general adult medical examination without abnormal findings: Secondary | ICD-10-CM | POA: Diagnosis not present

## 2018-05-09 DIAGNOSIS — Z1331 Encounter for screening for depression: Secondary | ICD-10-CM | POA: Diagnosis not present

## 2018-05-10 DIAGNOSIS — Z1212 Encounter for screening for malignant neoplasm of rectum: Secondary | ICD-10-CM | POA: Diagnosis not present

## 2018-10-19 ENCOUNTER — Other Ambulatory Visit: Payer: Self-pay | Admitting: Internal Medicine

## 2018-10-19 DIAGNOSIS — Z1231 Encounter for screening mammogram for malignant neoplasm of breast: Secondary | ICD-10-CM

## 2018-11-10 DIAGNOSIS — Z23 Encounter for immunization: Secondary | ICD-10-CM | POA: Diagnosis not present

## 2018-11-29 ENCOUNTER — Other Ambulatory Visit: Payer: Self-pay

## 2018-11-29 ENCOUNTER — Ambulatory Visit (INDEPENDENT_AMBULATORY_CARE_PROVIDER_SITE_OTHER): Payer: Medicare Other | Admitting: Sports Medicine

## 2018-11-29 ENCOUNTER — Encounter: Payer: Self-pay | Admitting: Sports Medicine

## 2018-11-29 ENCOUNTER — Ambulatory Visit (INDEPENDENT_AMBULATORY_CARE_PROVIDER_SITE_OTHER): Payer: Medicare Other

## 2018-11-29 DIAGNOSIS — L84 Corns and callosities: Secondary | ICD-10-CM | POA: Diagnosis not present

## 2018-11-29 DIAGNOSIS — M2041 Other hammer toe(s) (acquired), right foot: Secondary | ICD-10-CM

## 2018-11-29 DIAGNOSIS — M79674 Pain in right toe(s): Secondary | ICD-10-CM | POA: Diagnosis not present

## 2018-11-29 DIAGNOSIS — M2011 Hallux valgus (acquired), right foot: Secondary | ICD-10-CM | POA: Diagnosis not present

## 2018-11-29 NOTE — Progress Notes (Signed)
Subjective: Vicki Grant is a 73 y.o. female patient who presents to office for evaluation of Right foot pain. Patient complains of progressive pain especially over the last year in the Right 2, patient admits that she has had this problem since her last visit in 2014 but decided at that time not to do anything about it and slowly little bilateral start to form a heartburn or hard area of skin at the site of the second toe that sometimes gets very inflamed tender to touch patient reports that there is no pain today but last week it was very sore and inflamed.  Patient has tried cushions without complete relief in symptoms but does state that the cushion to help.  Patient is here to discuss treatment options.  Patient denies any other pedal complaints.   Patient Active Problem List   Diagnosis Date Noted  . Post-operative state 07/21/2013  . 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 Hammer toe 12/13/2012  . Other hammer toe (acquired) 12/13/2012  . Degenerative arthritis of toe joint 12/13/2012    Current Outpatient Medications on File Prior to Visit  Medication Sig Dispense Refill  . Ascorbic Acid (VITAMIN C ER PO) Take 2,000 mg by mouth.     Marland Kitchen aspirin 325 MG tablet Take 325 mg by mouth daily.    Marland Kitchen atorvastatin (LIPITOR) 20 MG tablet     . celecoxib (CELEBREX) 200 MG capsule Take 200 mg by mouth 2 (two) times daily.    . Cholecalciferol (VITAMIN D PO) Take 2,000 Units by mouth.     . diazepam (VALIUM) 5 MG tablet     . methocarbamol (ROBAXIN) 500 MG tablet     . Multiple Vitamin (MULTIVITAMIN) capsule Take 1 capsule by mouth daily.    . niacin (NIASPAN) 500 MG CR tablet Take 500 mg by mouth at bedtime.    . Omega-3-acid Ethyl Esters (LOVAZA PO) Take by mouth.    Marland Kitchen omeprazole (PRILOSEC) 20 MG capsule     . OVER THE COUNTER MEDICATION Calcium 1800mg     . OVER THE COUNTER MEDICATION Osteo Biflex    . OVER THE COUNTER MEDICATION Nature Made Probiotic    . RESTASIS 0.05 % ophthalmic emulsion      . tobramycin-dexamethasone (TOBRADEX) ophthalmic solution     . traMADol (ULTRAM) 50 MG tablet TAKE 1 OR 2 TABLETS BY MOUTH EVERY 4 TO 6 HOURS AS NEEDED FOR PAIN    . vitamin B-12 (CYANOCOBALAMIN) 1000 MCG tablet Take 3,000 mcg by mouth daily. 3000 units daily    . VITAMIN E EX Place 2,000 Units inside cheek.      No current facility-administered medications on file prior to visit.     Allergies  Allergen Reactions  . Codeine Nausea And Vomiting  . Darvocet [Propoxyphene N-Acetaminophen] Nausea And Vomiting  . Other     Certain general anesthesia makes her nauseated  . Penicillins     Objective:  General: Alert and oriented x3 in no acute distress  Dermatology: Small hyperkeratotic lesion overlying medial 2nd toe at DIPJ right. No open lesions bilateral lower extremities, no webspace macerations, no ecchymosis bilateral, all nails x 10 are well manicured.  Vascular: Dorsalis Pedis and Posterior Tibial pedal pulses 2/4, Capillary Fill Time 3 seconds,(+) pedal hair growth bilateral, no edema bilateral lower extremities, Temperature gradient within normal limits.  Neurology: Johney Maine sensation intact via light touch bilateral  Musculoskeletal: Rigid hammertoe deformity right second toe with early bunion deformity noted on right.  No pain  with calf compression bilateral.  Strength within normal limits in all groups bilateral.   Gait: Unassisted, Non-antalgic.  Xrays  Righ Foot    Impression: Hammertoe deformity with early hallux valgus with mild impingement and crossover, normal osseous mineralization no other acute findings.       Assessment and Plan: Problem List Items Addressed This Visit    None    Visit Diagnoses    Hammertoe of right foot    -  Primary   Relevant Orders   DG Foot Complete Right (Completed)   Toe pain, right       Acquired hallux valgus of right foot       Early   Corns and callosities         -Complete examination performed -Xrays  reviewed -Discussed treatement options for hammertoe with impingement of the hallux causing a kissing callus -Patient is refusing surgery at this time -At no charge mechanically debrided keratotic lesion at the medial aspect of the right second toe and dispensed toe spacer -Advised patient if pain continues to worsen may benefit from a steroid shot versus considering surgical correction -Patient to return to office as needed or sooner if condition worsens.  Landis Martins, DPM

## 2018-12-05 ENCOUNTER — Other Ambulatory Visit: Payer: Self-pay

## 2018-12-05 ENCOUNTER — Ambulatory Visit
Admission: RE | Admit: 2018-12-05 | Discharge: 2018-12-05 | Disposition: A | Discharge status: Home / Self Care | Payer: Medicare Other | Source: Ambulatory Visit | Attending: Internal Medicine | Admitting: Internal Medicine

## 2018-12-05 DIAGNOSIS — Z1231 Encounter for screening mammogram for malignant neoplasm of breast: Secondary | ICD-10-CM

## 2019-01-31 DIAGNOSIS — Z970 Presence of artificial eye: Secondary | ICD-10-CM | POA: Diagnosis not present

## 2019-01-31 DIAGNOSIS — H04123 Dry eye syndrome of bilateral lacrimal glands: Secondary | ICD-10-CM | POA: Diagnosis not present

## 2019-01-31 DIAGNOSIS — H2512 Age-related nuclear cataract, left eye: Secondary | ICD-10-CM | POA: Diagnosis not present

## 2019-01-31 DIAGNOSIS — H52203 Unspecified astigmatism, bilateral: Secondary | ICD-10-CM | POA: Diagnosis not present

## 2019-04-02 ENCOUNTER — Ambulatory Visit: Payer: Medicare Other

## 2019-04-17 ENCOUNTER — Ambulatory Visit: Payer: Medicare Other

## 2019-10-23 ENCOUNTER — Other Ambulatory Visit: Payer: Self-pay | Admitting: Internal Medicine

## 2019-10-23 DIAGNOSIS — Z1231 Encounter for screening mammogram for malignant neoplasm of breast: Secondary | ICD-10-CM

## 2019-12-07 ENCOUNTER — Other Ambulatory Visit: Payer: Self-pay

## 2019-12-07 ENCOUNTER — Ambulatory Visit
Admission: RE | Admit: 2019-12-07 | Discharge: 2019-12-07 | Disposition: A | Discharge status: Home / Self Care | Payer: Medicare HMO | Source: Ambulatory Visit | Attending: Internal Medicine | Admitting: Internal Medicine

## 2019-12-07 DIAGNOSIS — Z1231 Encounter for screening mammogram for malignant neoplasm of breast: Secondary | ICD-10-CM

## 2020-06-13 ENCOUNTER — Other Ambulatory Visit (HOSPITAL_COMMUNITY): Payer: Self-pay | Admitting: Internal Medicine

## 2020-06-13 DIAGNOSIS — R011 Cardiac murmur, unspecified: Secondary | ICD-10-CM

## 2020-07-11 ENCOUNTER — Ambulatory Visit (HOSPITAL_COMMUNITY): Payer: Medicare HMO | Attending: Cardiology

## 2020-07-11 ENCOUNTER — Other Ambulatory Visit: Payer: Self-pay

## 2020-07-11 DIAGNOSIS — R011 Cardiac murmur, unspecified: Secondary | ICD-10-CM | POA: Diagnosis present

## 2020-07-11 LAB — ECHOCARDIOGRAM COMPLETE
Area-P 1/2: 3.12 cm2
MV VTI: 1.43 cm2
S' Lateral: 2.1 cm

## 2020-07-18 ENCOUNTER — Other Ambulatory Visit: Payer: Self-pay | Admitting: Orthopedic Surgery

## 2020-07-18 DIAGNOSIS — G8929 Other chronic pain: Secondary | ICD-10-CM

## 2020-07-21 ENCOUNTER — Ambulatory Visit
Admission: RE | Admit: 2020-07-21 | Discharge: 2020-07-21 | Disposition: A | Discharge status: Home / Self Care | Payer: Medicare HMO | Source: Ambulatory Visit | Attending: Orthopedic Surgery | Admitting: Orthopedic Surgery

## 2020-07-21 DIAGNOSIS — M25562 Pain in left knee: Secondary | ICD-10-CM

## 2020-07-21 DIAGNOSIS — G8929 Other chronic pain: Secondary | ICD-10-CM

## 2020-10-20 ENCOUNTER — Encounter: Payer: Self-pay | Admitting: Gastroenterology

## 2020-11-05 ENCOUNTER — Other Ambulatory Visit: Payer: Self-pay | Admitting: Internal Medicine

## 2020-11-05 DIAGNOSIS — Z1231 Encounter for screening mammogram for malignant neoplasm of breast: Secondary | ICD-10-CM

## 2020-12-09 ENCOUNTER — Other Ambulatory Visit: Payer: Self-pay

## 2020-12-09 ENCOUNTER — Ambulatory Visit
Admission: RE | Admit: 2020-12-09 | Discharge: 2020-12-09 | Disposition: A | Discharge status: Home / Self Care | Payer: Medicare HMO | Source: Ambulatory Visit | Attending: Internal Medicine | Admitting: Internal Medicine

## 2020-12-09 DIAGNOSIS — Z1231 Encounter for screening mammogram for malignant neoplasm of breast: Secondary | ICD-10-CM

## 2021-07-09 ENCOUNTER — Ambulatory Visit: Payer: Self-pay

## 2021-07-09 ENCOUNTER — Ambulatory Visit (INDEPENDENT_AMBULATORY_CARE_PROVIDER_SITE_OTHER): Payer: Medicare HMO | Admitting: Family Medicine

## 2021-07-09 VITALS — BP 157/57 | Ht 61.0 in | Wt 113.0 lb

## 2021-07-09 DIAGNOSIS — M25531 Pain in right wrist: Secondary | ICD-10-CM

## 2021-07-09 NOTE — Patient Instructions (Signed)
Thank you for coming to see me today. It was a pleasure. Today we talked about:  ? ?As we discussed, you have some swelling around a tendon of your wrist.  Use voltaren gel 4 times a day on the area and ice it for 15 min every night.  Use the brace during the day.  Don't lift weights currently. ? ?If you have severe worsening of pain, especially if you get a fever, you should do to Urgent Care or the Emergency Department right away. ? ?Please follow-up with [place] in 2 weeks. ? ?If you have any questions or concerns, please do not hesitate to call the office at [phone]. ? ?Best,  ? ?[name], DO ?[place]  ?

## 2021-07-09 NOTE — Progress Notes (Signed)
? ?  Vicki Grant is a 76 y.o. female who presents to [facility] today for the following: ? 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 ?PMH reviewed.  ?ROS as above. ?Medications reviewed. ? ?Exam:  ?BP (!) 157/57   Ht '5\' 1"'$  (1.549 m)   Wt 113 lb (51.3 kg)   BMI 21.35 kg/m?  ?Gen: Well NAD ?MSK: ? ?Right Wrist: ?Inspection: Swelling over the dorsolateral aspect of the right wrist. No erythema or bruising b/l ?Palpation: Swelling does appear slightly fluctuant, but minimal tenderness. ?ROM: Full ROM of the digits and wrist b/l. Fully able to extend and flex all fingers. ?Strength: 5/5 strength in the forearm, wrist and interosseus muscles b/l ?Neurovascular: NV intact b/l ? ?Limited Ultrasound of the right dorsolateral wrist: ?ECU tendon visualized in transverse and longitudinal axis with hypoechoic fluid surrounding the tendon over the TFCC and just proximal to this area.  Possible small partial thickness tear in this area.  There is one small hyperechoic crystal noted, but no others within the effusion.  There is increased doppler flow surrounding the tendon.  TFCC visualized with some hypoechoic fluid overtop, but this connects to the ECU collection, likely representing the ECU tenosynovitis. ?Impression: ECU tenosynovitis vs partial thickness tear ? ? ?No results found. ? ? ?Assessment and Plan: ?1) Right wrist pain ?Exam and [place] with swelling around ECU consistent with tenosynovitis.  No systemic symptoms to suggest infection, likely related to overuse.  Patient reports inability to tolerate PO prednisone.  Will treat with  volar wrist brace for rest, voltaren gel QID, icing daily, and no lifting.  F/U in 2 weeks.  If not making significant improvement, could consider US guided corticosteroid injection. ? ? ?[name], D.O.  ?PGY-4 [facility]  ?07/09/2021 4:07 PM ? ?Addendum:  I was the preceptor for this visit and available for immediate consultation.  Karlton Lemon MD CAQSM ? ?

## 2021-07-09 NOTE — Assessment & Plan Note (Signed)
Exam and [place] with swelling around ECU consistent with thenosynovitis.  No systemic symptoms to suggest infection, likely related to overuse.  Patient reports inability to tolerate PO prednisone.  Will treat with volar wrist brace for rest, voltaren gel QID, icing daily, and no lifting.  F/U in 2 weeks.  If not making significant improvement, could consider US guided corticosteroid injection. ?

## 2021-07-10 ENCOUNTER — Other Ambulatory Visit: Payer: Self-pay

## 2021-07-10 MED ORDER — PREDNISONE 10 MG (21) PO TBPK: ORAL_TABLET | Freq: Every day | ORAL | 0 refills | Status: DC

## 2021-07-10 NOTE — Progress Notes (Signed)
Pt states she decided to get a prescription for prednisone. It was discussed at yesterday's visit.   Rx sent to pharmacy.

## 2021-07-16 ENCOUNTER — Ambulatory Visit: Payer: Medicare HMO | Admitting: Family Medicine

## 2021-07-23 ENCOUNTER — Ambulatory Visit (INDEPENDENT_AMBULATORY_CARE_PROVIDER_SITE_OTHER): Payer: Medicare HMO | Admitting: Family Medicine

## 2021-07-23 VITALS — BP 158/78 | Ht 61.0 in | Wt 112.0 lb

## 2021-07-23 DIAGNOSIS — M25531 Pain in right wrist: Secondary | ICD-10-CM | POA: Diagnosis not present

## 2021-07-23 MED ORDER — NITROGLYCERIN 0.2 MG/HR TD PT24: MEDICATED_PATCH | TRANSDERMAL | 1 refills | Status: DC

## 2021-07-23 NOTE — Progress Notes (Signed)
   Vicki Grant is a 76 y.o. female who presents to [facility] today for the following:  Right wrist pain follow-up Last seen for this on 5/17 At that time had an ultrasound consistent with possible partial thickness tear versus tenosynovitis of the ECU She was placed on prednisone Dosepak Did not see much improvement with prednisone Has been wearing her brace most of the time, when she has to do things that require more mobility of her right hand, she switches to an Ace wrap She has been doing some exercises at the gym and being very careful that it does not worsen the pain Pain is still in the medial aspect of the wrist Still notices some swelling in this area as well   PMH reviewed.  ROS as above. Medications reviewed.  Exam:  BP (!) 158/78   Ht '5\' 1"'$  (1.549 m)   Wt 112 lb (50.8 kg)   BMI 21.16 kg/m  Gen: Well NAD MSK:  Right Wrist: Inspection: She continues to have some slight swelling over the medial aspect of her right wrist in the region of the ECU tendon.  No overlying erythema or bruising b/l Palpation: Mild tenderness palpation of the region of the right ECU tendon. ROM: Full ROM of the digits and wrist b/l. Neurovascular: NV intact b/l   Assessment and Plan: 1) Right wrist pain No improvement with oral prednisone.  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  We discussed treatment options including continued conservative management versus trialing nitroglycerin patches.  She would like to trial this.  No history of headaches.  Did discuss for her to watch for signs of hypotension.  Also given exercises for this.  Continue to use brace as possible.  We will have her follow-up in 4 weeks and rescan at that time to see how she is doing.  We did discuss that given likely partial tear, the best long-term outcome would not be to inject with corticosteroid at this time.  Could consider this as a last resort in the  future if not improving.   [name], D.O.  PGY-4 [facility]  07/23/2021 2:39 PM  Addendum:  I was the preceptor for this visit and available for immediate consultation.  Karlton Lemon MD [name]

## 2021-07-23 NOTE — Assessment & Plan Note (Signed)
No improvement with oral prednisone.  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  We discussed treatment options including continued conservative management versus trialing nitroglycerin patches.  She would like to trial this.  No history of headaches.  Did discuss for her to watch for signs of hypotension.  Also given exercises for this.  Continue to use brace as possible.  We will have her follow-up in 4 weeks and rescan at that time to see how she is doing.  We did discuss that given likely partial tear, the best long-term outcome would not be to inject with corticosteroid at this time.  Could consider this as a last resort in the future if not improving.

## 2021-07-23 NOTE — Patient Instructions (Signed)
Thank you for coming to see me today. It was a pleasure. Today we talked about:   We will start you using nitroglycerin patches.  Please use the instructions below for this.  We will give you some home exercises as well, you can do these about 5 times a week.  Let pain be your guide with them.  If you are unable to tolerate the nitroglycerin patches, please call us.  Please follow-up with [place] in 1 month.  If you have any questions or concerns, please do not hesitate to call the office at [phone].  Best,   [name], DO [place]     Nitroglycerin Protocol  Apply 1/4 nitroglycerin patch to affected area daily. Change position of patch within the affected area every 24 hours. You may experience a headache during the first 1-2 weeks of using the patch, these should subside. If you experience headaches after beginning nitroglycerin patch treatment, you may take your preferred over the counter pain reliever. Another side effect of the nitroglycerin patch is skin irritation or rash related to patch adhesive. Please notify our office if you develop more severe headaches or rash, and stop the patch. Tendon healing with nitroglycerin patch may require 12 to 24 weeks depending on the extent of injury. Men should not use if taking Viagra, Cialis, or Levitra.  Do not use if you have migraines or rosacea.

## 2021-08-20 ENCOUNTER — Ambulatory Visit: Payer: Medicare HMO | Admitting: Family Medicine

## 2021-10-22 ENCOUNTER — Encounter: Payer: Self-pay | Admitting: Gastroenterology

## 2021-10-29 ENCOUNTER — Other Ambulatory Visit: Payer: Self-pay | Admitting: Internal Medicine

## 2021-10-29 DIAGNOSIS — Z1231 Encounter for screening mammogram for malignant neoplasm of breast: Secondary | ICD-10-CM

## 2021-11-10 ENCOUNTER — Telehealth: Payer: Self-pay | Admitting: *Deleted

## 2021-11-10 ENCOUNTER — Ambulatory Visit (AMBULATORY_SURGERY_CENTER): Payer: Medicare HMO | Admitting: *Deleted

## 2021-11-10 VITALS — Ht 61.0 in | Wt 116.6 lb

## 2021-11-10 DIAGNOSIS — Z86010 Personal history of colonic polyps: Secondary | ICD-10-CM

## 2021-11-10 NOTE — Progress Notes (Signed)
No egg or soy allergy known to patient  Pt had PONV Patient denies ever being told they had issues or difficulty with intubation  No FH of Malignant Hyperthermia Pt is not on diet pills Pt is not on  home 02  Pt is not on blood thinners  Pt denies issues with constipation  Pt encouraged to use to use Singlecare or Goodrx to reduce cost  She had a partial right knee replacement November 2022.  She has a letter from her surgeon stating she is to have a pre procedure antibiotic prior to colonoscopies.  Her letter states she is have Amoxicillin 500 mg or Clindamycin 300 mg 30 minutes to 1 hour prior to procedure.  FYI- she is allergic to PCN.  I explained we do not pre medicate with antibiotics for procedure but pt states her surgeon was emphatic about her needing antibiotic. TE sent to Dr. Loletha Carrow to advise

## 2021-11-10 NOTE — Telephone Encounter (Signed)
Dr. Loletha Carrow,  I saw this pt today at [facility]; her recall colonoscopy is 12-19-21.  She had a partial right knee replacement November 2022.  She has a letter from her surgeon stating she is to have a pre procedure antibiotic prior to colonoscopies.  Her letter states she is have Amoxicillin 500 mg or Clindamycin 300 mg 30 minutes to 1 hour prior to procedure.  FYI- she is allergic to PCN.  I explained we do not pre medicate with antibiotics for procedure but pt states her surgeon was emphatic about her needing antibiotic.  Please advise.  Thanks, [employer]

## 2021-11-11 NOTE — Telephone Encounter (Signed)
Thank you for the note.  GI endoscopy guidelines do not recommend antibiotic prophylaxis for joint replacements before colonoscopy.  In addition, antibiotics are not without their risks, most notably C. difficile colitis.  If her orthopedic surgeon feels strongly about it and they and the patient are both in agreement and willing to accept the risk of antibiotics, then her orthopedic surgeon is certainly welcome to prescribe the antibiotics for her.  - HD

## 2021-11-11 NOTE — Telephone Encounter (Signed)
Spoke with pt about Dr. Loletha Carrow' recommendations.  Understanding voiced and if fine continuing without prophylactic antibiotic

## 2021-12-10 ENCOUNTER — Ambulatory Visit: Payer: Medicare HMO

## 2021-12-10 ENCOUNTER — Encounter: Payer: Self-pay | Admitting: Gastroenterology

## 2021-12-13 ENCOUNTER — Encounter: Payer: Self-pay | Admitting: Certified Registered Nurse Anesthetist

## 2021-12-19 ENCOUNTER — Encounter: Payer: Self-pay | Admitting: Gastroenterology

## 2021-12-19 ENCOUNTER — Ambulatory Visit (AMBULATORY_SURGERY_CENTER): Payer: Medicare HMO | Admitting: Gastroenterology

## 2021-12-19 VITALS — BP 137/52 | HR 63 | Temp 97.3°F | Resp 15 | Ht 61.0 in | Wt 116.6 lb

## 2021-12-19 DIAGNOSIS — Z09 Encounter for follow-up examination after completed treatment for conditions other than malignant neoplasm: Secondary | ICD-10-CM | POA: Diagnosis present

## 2021-12-19 DIAGNOSIS — Z86010 Personal history of colonic polyps: Secondary | ICD-10-CM

## 2021-12-19 MED ORDER — SODIUM CHLORIDE 0.9 % IV SOLN: 500.0000 mL | Freq: Once | INTRAVENOUS | Status: AC

## 2021-12-19 NOTE — Op Note (Signed)
[facility] Patient Name: Vicki Grant Procedure Date: 12/19/2021 1:18 PM MRN: [id number] Endoscopist: Mallie Mussel L. Loletha Carrow , MD, [id number] Age: 76 Referring MD:  Date of Birth: 11-Sep-1945 Gender: Female Account #: [id number] Procedure:                Colonoscopy Indications:              Surveillance: Personal history of adenomatous                            polyps on last colonoscopy > 5 years ago                           diminutive TA x 25 Jun 2015 (no polyps 2007) Medicines:                Monitored Anesthesia Care Procedure:                Pre-Anesthesia Assessment:                           - Prior to the procedure, a History and Physical                            was performed, and patient medications and                            allergies were reviewed. The patient's tolerance of                            previous anesthesia was also reviewed. The risks                            and benefits of the procedure and the sedation                            options and risks were discussed with the patient.                            All questions were answered, and informed consent                            was obtained. Prior Anticoagulants: The patient has                            taken no anticoagulant or antiplatelet agents. ASA                            Grade Assessment: II - A patient with mild systemic                            disease. After reviewing the risks and benefits,                            the patient was deemed in satisfactory condition to  undergo the procedure.                           After obtaining informed consent, the colonoscope                            was passed under direct vision. Throughout the                            procedure, the patient's blood pressure, pulse, and                            oxygen saturations were monitored continuously. The                            Colonoscope was introduced  through the anus and                            advanced to the the cecum, identified by                            appendiceal orifice and ileocecal valve. The                            colonoscopy was somewhat difficult due to a                            redundant colon and a tortuous colon. Successful                            completion of the procedure was aided by using                            manual pressure and straightening and shortening                            the scope to obtain bowel loop reduction. The                            patient tolerated the procedure well. The quality                            of the bowel preparation was excellent. The                            ileocecal valve, appendiceal orifice, and rectum                            were photographed. Scope In: 1:32:59 PM Scope Out: 1:49:04 PM Scope Withdrawal Time: 0 hours 8 minutes 22 seconds  Total Procedure Duration: 0 hours 16 minutes 5 seconds  Findings:                 The perianal and digital rectal examinations were  normal.                           There is no endoscopic evidence of polyps in the                            entire colon.                           Multiple diverticula were found in the left colon.                           The sigmoid colon was redundant and tortuous.                           Repeat examination of right colon under NBI                            performed.                           The exam was otherwise without abnormality on                            direct and retroflexion views. Complications:            No immediate complications. Estimated Blood Loss:     Estimated blood loss: none. Impression:               - Diverticulosis in the left colon.                           - Redundant colon.                           - The examination was otherwise normal on direct                            and retroflexion views.                            - No specimens collected. Recommendation:           - Patient has a contact number available for                            emergencies. The signs and symptoms of potential                            delayed complications were discussed with the                            patient. Return to normal activities tomorrow.                            Written discharge instructions were provided to the  patient.                           - Resume previous diet.                           - Continue present medications.                           - No repeat surveillance colonoscopy recommended                            due to age, current guidelines and no polyps on                            this exam. Chesni Vos L. Loletha Carrow, MD 12/19/2021 1:59:30 PM This report has been signed electronically.

## 2021-12-19 NOTE — Progress Notes (Signed)
1320 Patient experiencing nausea with all types of anesthesia and requesting anti-nausea  meds. MD updated and Zofran 4 mg IV given, vss

## 2021-12-19 NOTE — Progress Notes (Signed)
History and Physical:  This patient presents for endoscopic testing for: Encounter Diagnosis  Name Primary?   Personal history of colonic polyps Yes    76 year old woman here for surveillance colonoscopy.  2 diminutive adenomatous polyps removed at the time of her last colonoscopy in May 2017.  No polyps on the 2007 colonoscopy. Patient denies chronic abdominal pain, rectal bleeding, constipation or diarrhea.   Patient is otherwise without complaints or active issues today.   Past Medical History: Past Medical History:  Diagnosis Date   Breast cancer ([place])    at age 3   GERD (gastroesophageal reflux disease)    Heart murmur    Hyperlipidemia    OA (osteoarthritis)    Osteopenia    Personal history of radiation therapy    Reflux      Past Surgical History: Past Surgical History:  Procedure Laterality Date   BREAST BIOPSY Right    benign   BREAST LUMPECTOMY  1996   left   COLONOSCOPY     ENUCLEATION     EYE SURGERY     right eye removed 2000   KNEE ARTHROSCOPY  2011   bilat, then 2022 left   PARTIAL KNEE ARTHROPLASTY     right knee- November 2022    Allergies: Allergies  Allergen Reactions   Codeine Nausea And Vomiting   Darvocet [Propoxyphene N-Acetaminophen] Nausea And Vomiting   Other     Certain general anesthesia makes her nauseated   Penicillins     Muscle weakness    Outpatient Meds: Current Outpatient Medications  Medication Sig Dispense Refill   Acetaminophen (TYLENOL PO) Take by mouth as needed.     Ascorbic Acid (VITAMIN C ER PO) Take 2,000 mg by mouth.      aspirin 325 MG tablet Take 81 mg by mouth daily.     atorvastatin (LIPITOR) 20 MG tablet daily.     Cholecalciferol (VITAMIN D PO) Take 4,000 Units by mouth.     diazepam (VALIUM) 5 MG tablet as needed.     methocarbamol (ROBAXIN) 500 MG tablet as needed.     niacin (NIASPAN) 500 MG CR tablet Take 500 mg by mouth at bedtime.     Omega-3-acid Ethyl Esters (LOVAZA PO) Take 1 g by mouth 4  (four) times daily.     omeprazole (PRILOSEC) 20 MG capsule daily.     RESTASIS 0.05 % ophthalmic emulsion Twice daily     valACYclovir (VALTREX) 1000 MG tablet Take 1,000 mg by mouth once.     celecoxib (CELEBREX) 200 MG capsule Take 200 mg by mouth daily.     ibuprofen (ADVIL) 600 MG tablet Take 1 tablet by mouth every 6 (six) hours as needed.     nitroGLYCERIN (NITRODUR - DOSED IN MG/24 HR) 0.2 mg/hr patch Use 1/4 patch daily to the affected area. 30 patch 1   OVER THE COUNTER MEDICATION 1,650 mg daily. Calcium '1800mg'$      OVER THE COUNTER MEDICATION daily. Osteo Biflex     OVER THE COUNTER MEDICATION daily. Nature Made Probiotic     predniSONE (STERAPRED UNI-PAK 21 TAB) 10 MG (21) TBPK tablet Take by mouth daily. 1 tablet 0   tobramycin-dexamethasone (TOBRADEX) ophthalmic solution      traMADol (ULTRAM) 50 MG tablet TAKE 1 OR 2 TABLETS BY MOUTH EVERY 4 TO 6 HOURS AS NEEDED FOR PAIN     Turmeric (QC TUMERIC COMPLEX PO) Take 2,000 mg by mouth daily.     vitamin B-12 (CYANOCOBALAMIN) 1000 MCG  tablet Take 2,000 mcg by mouth daily. 3000 units daily     VITAMIN E EX Place 2,000 Units inside cheek.      Current Facility-Administered Medications  Medication Dose Route Frequency Provider Last Rate Last Admin   0.9 %  sodium chloride infusion  500 mL Intravenous Once Doran Stabler, MD          ___________________________________________________________________ Objective   Exam:  BP (!) 160/63   Pulse 69   Temp (!) 97.3 F (36.3 C) (Temporal)   Resp 14   Ht '5\' 1"'$  (1.549 m)   Wt 116 lb 9.6 oz (52.9 kg)   SpO2 99%   BMI 22.03 kg/m   CV: regular , S1/S2 Resp: clear to auscultation bilaterally, normal RR and effort noted GI: soft, no tenderness, with active bowel sounds.   Assessment: Encounter Diagnosis  Name Primary?   Personal history of colonic polyps Yes     Plan: Colonoscopy  The benefits and risks of the planned procedure were described in detail with the patient  or (when appropriate) their health care proxy.  Risks were outlined as including, but not limited to, bleeding, infection, perforation, adverse medication reaction leading to cardiac or pulmonary decompensation, pancreatitis (if ERCP).  The limitation of incomplete mucosal visualization was also discussed.  No guarantees or warranties were given.    The patient is appropriate for an endoscopic procedure in the ambulatory setting.   - Wilfrid Lund, MD

## 2021-12-19 NOTE — Patient Instructions (Signed)
Read all of the handouts given to you by your recovery room nurse.  Resume all of your current medications.  YOU HAD AN ENDOSCOPIC PROCEDURE TODAY AT [facility]:   Refer to the procedure report that was given to you for any specific questions about what was found during the examination.  If the procedure report does not answer your questions, please call your gastroenterologist to clarify.  If you requested that your care partner not be given the details of your procedure findings, then the procedure report has been included in a sealed envelope for you to review at your convenience later.  YOU SHOULD EXPECT: Some feelings of bloating in the abdomen. Passage of more gas than usual.  Walking can help get rid of the air that was put into your GI tract during the procedure and reduce the bloating.  Please Note:  You might notice some irritation and congestion in your nose or some drainage.  This is from the oxygen used during your procedure.  There is no need for concern and it should clear up in a day or so.  SYMPTOMS TO REPORT IMMEDIATELY:  Following lower endoscopy (colonoscopy or flexible sigmoidoscopy):  Excessive amounts of blood in the stool  Significant tenderness or worsening of abdominal pains  Swelling of the abdomen that is new, acute  Fever of 100F or higher   For urgent or emergent issues, a gastroenterologist can be reached at any hour by calling [phone]. Do not use MyChart messaging for urgent concerns.    DIET:  We do recommend a small meal at first, but then you may proceed to your regular diet.  Drink plenty of fluids but you should avoid alcoholic beverages for 24 hours. Try to increase the fiber in your dirt, and drink plenty of water.  ACTIVITY:  You should plan to take it easy for the rest of today and you should NOT DRIVE or use heavy machinery until tomorrow (because of the sedation medicines used during the test).    FOLLOW UP: Our staff  will call the number listed on your records the next business day following your procedure.  We will call around 7:15- 8:00 am to check on you and address any questions or concerns that you may have regarding the information given to you following your procedure. If we do not reach you, we will leave a message.        SIGNATURES/CONFIDENTIALITY: You and/or your care partner have signed paperwork which will be entered into your electronic medical record.  These signatures attest to the fact that that the information above on your After Visit Summary has been reviewed and is understood.  Full responsibility of the confidentiality of this discharge information lies with you and/or your care-partner.

## 2021-12-22 ENCOUNTER — Telehealth: Payer: Self-pay

## 2021-12-22 NOTE — Telephone Encounter (Signed)
No answer, left message to call if having any issues or concerns, B.Delyla Sandeen RN 

## 2022-01-07 ENCOUNTER — Ambulatory Visit
Admission: RE | Admit: 2022-01-07 | Discharge: 2022-01-07 | Disposition: A | Discharge status: Home / Self Care | Payer: Medicare HMO | Source: Ambulatory Visit | Attending: Internal Medicine | Admitting: Internal Medicine

## 2022-01-07 DIAGNOSIS — Z1231 Encounter for screening mammogram for malignant neoplasm of breast: Secondary | ICD-10-CM

## 2022-05-24 IMAGING — MG MM DIGITAL SCREENING BILAT W/ TOMO AND CAD
8 series · 9 of 24 positions shown · non-contrast
Comparison: Previous exam(s).

CLINICAL DATA: Screening.

EXAM:
DIGITAL SCREENING BILATERAL MAMMOGRAM WITH TOMOSYNTHESIS AND CAD
TECHNIQUE: Bilateral screening digital craniocaudal and mediolateral oblique
mammograms were obtained. Bilateral screening digital breast
tomosynthesis was performed. The images were evaluated with
computer-aided detection.

[L CC synth-2D]
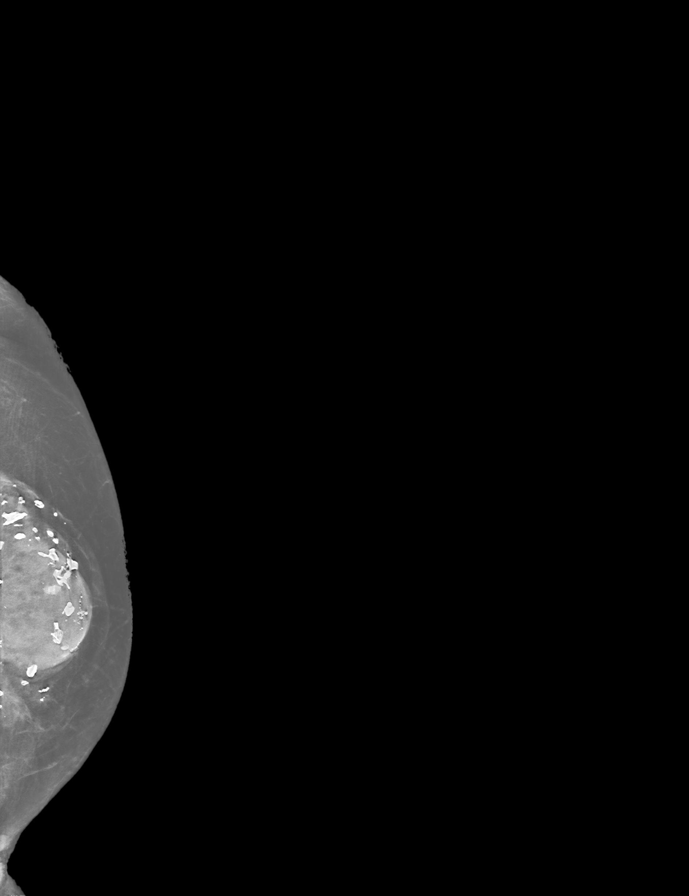

[R CC synth-2D]
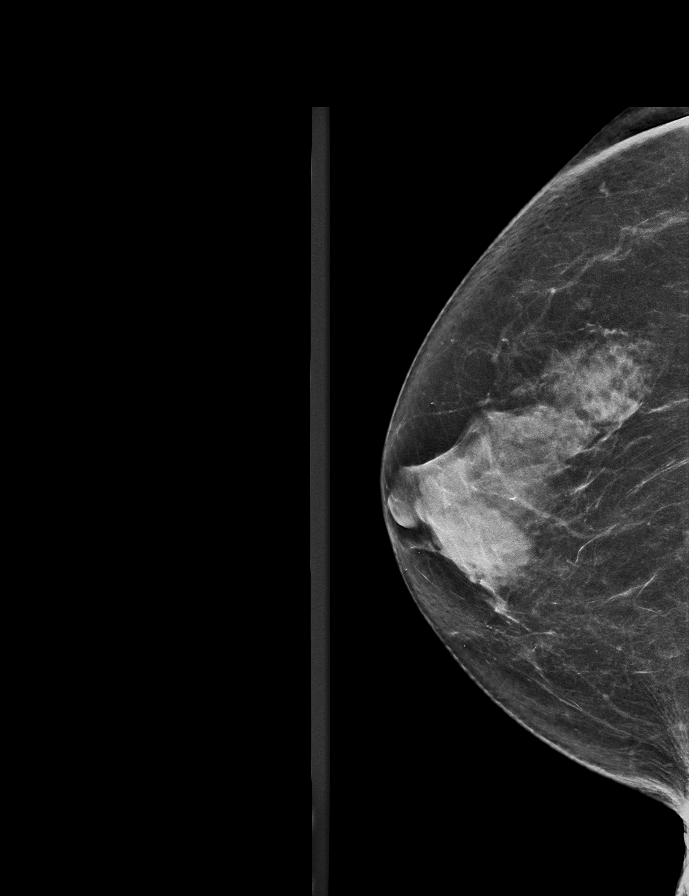

[L MLO synth-2D]
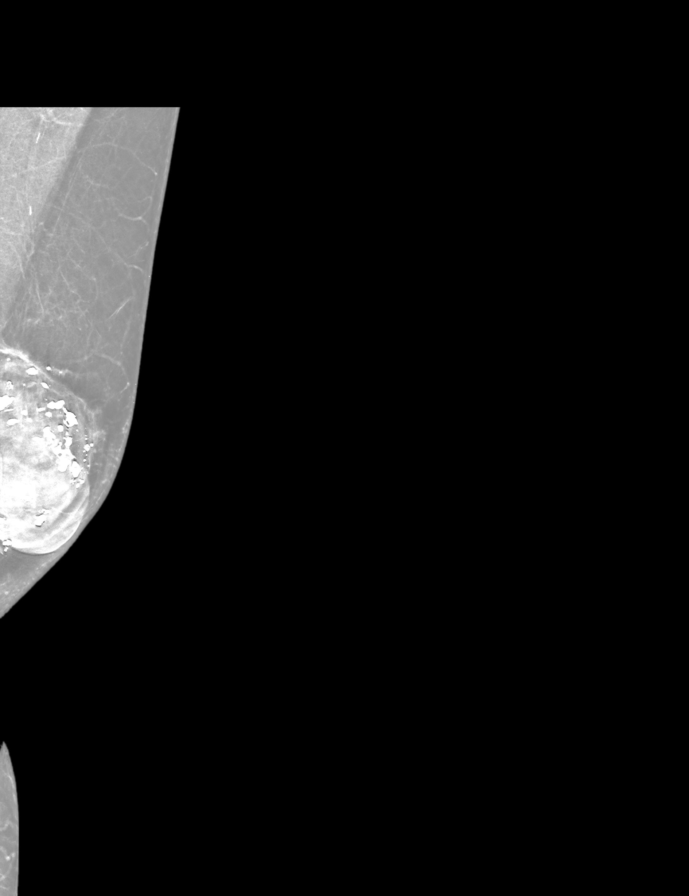

[R MLO synth-2D]
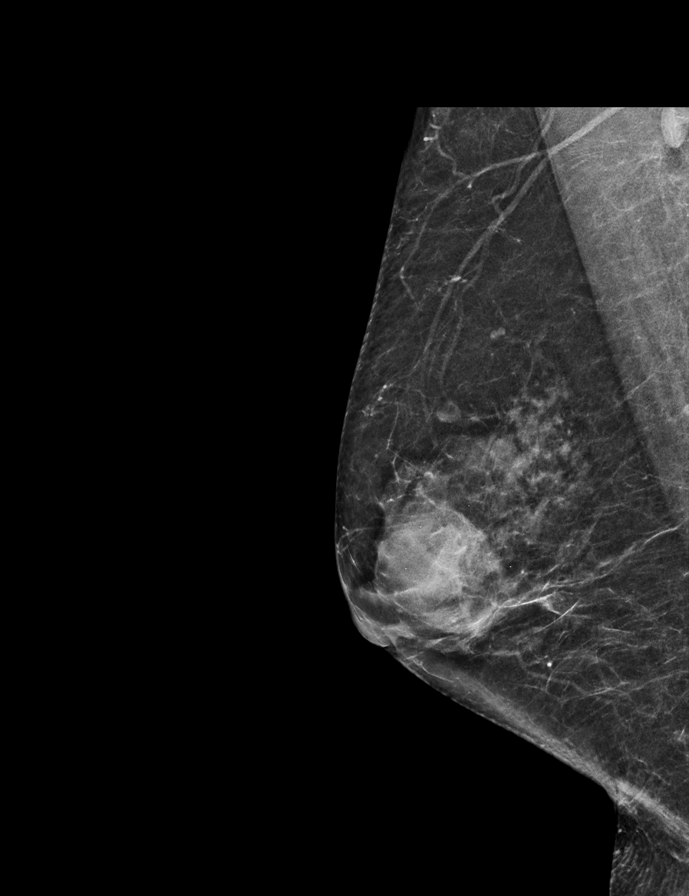

[R CC tomo · 2 of 65 frames shown]
[frame 21/65]
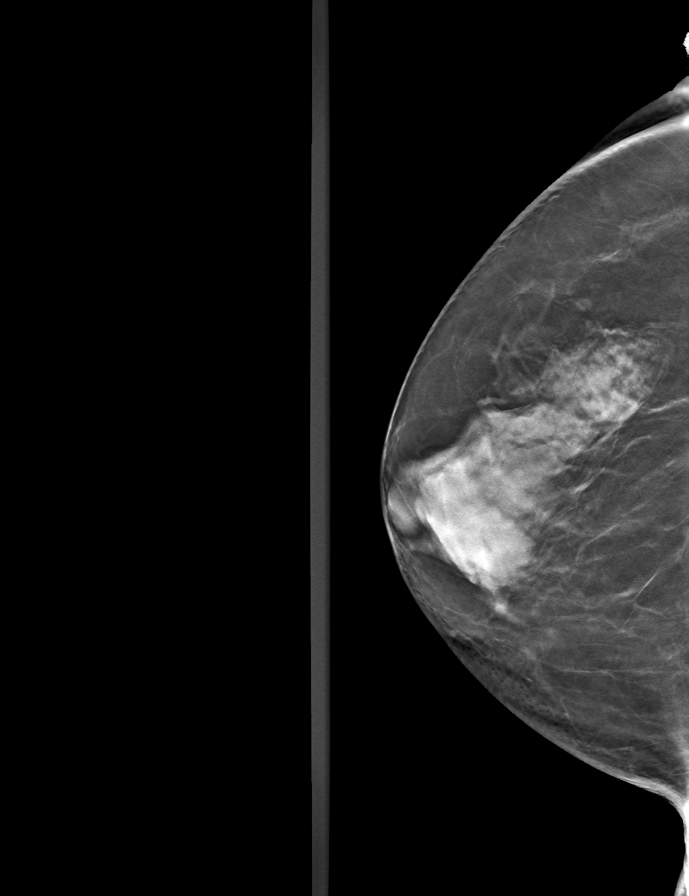
[frame 33/65]
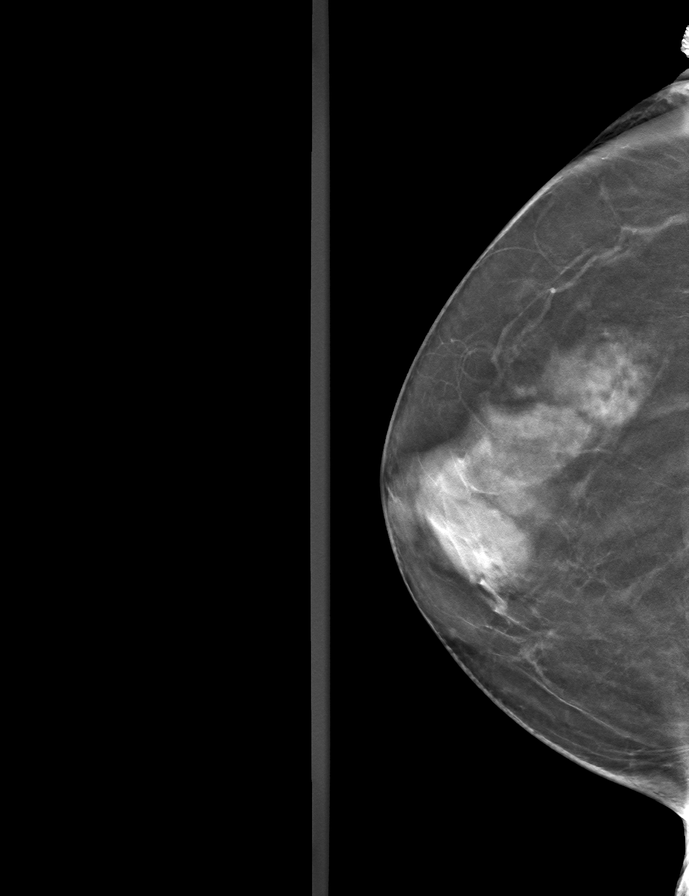

[L MLO tomo · tomo slice 25/48.0]
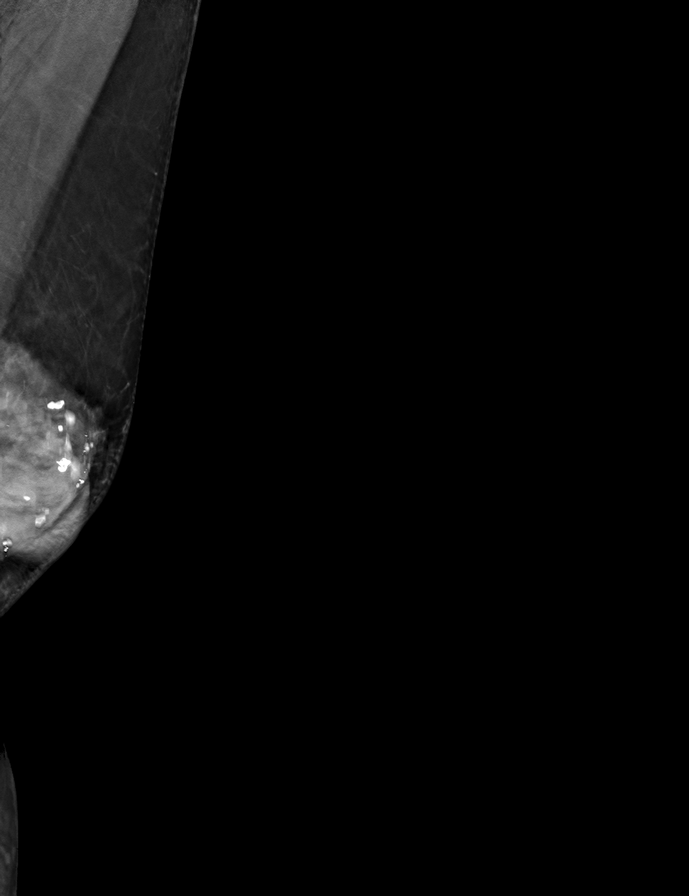

[R MLO tomo · tomo slice 27/54.0]
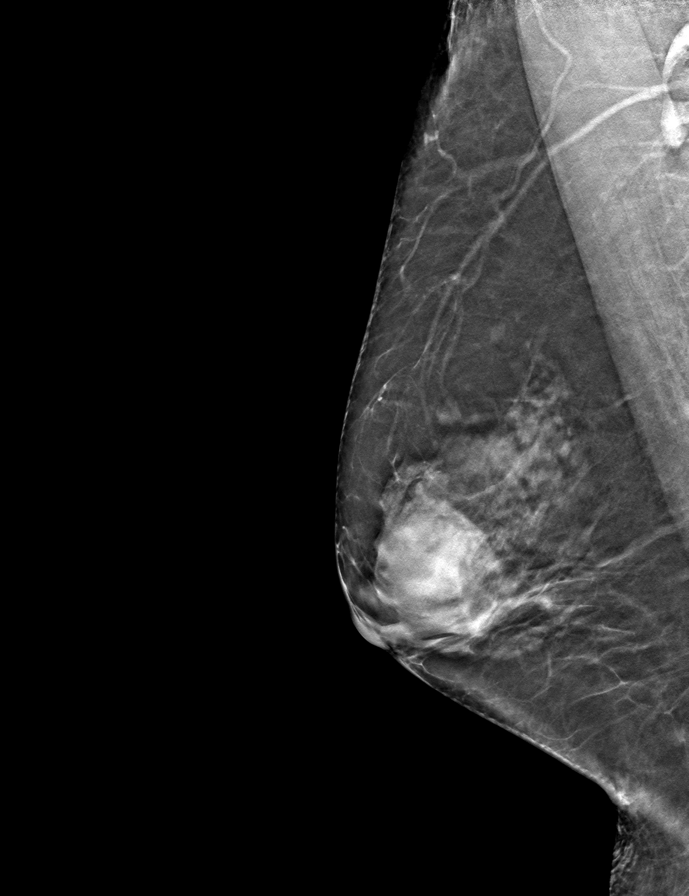

[L CC tomo · tomo slice 28/55.0]
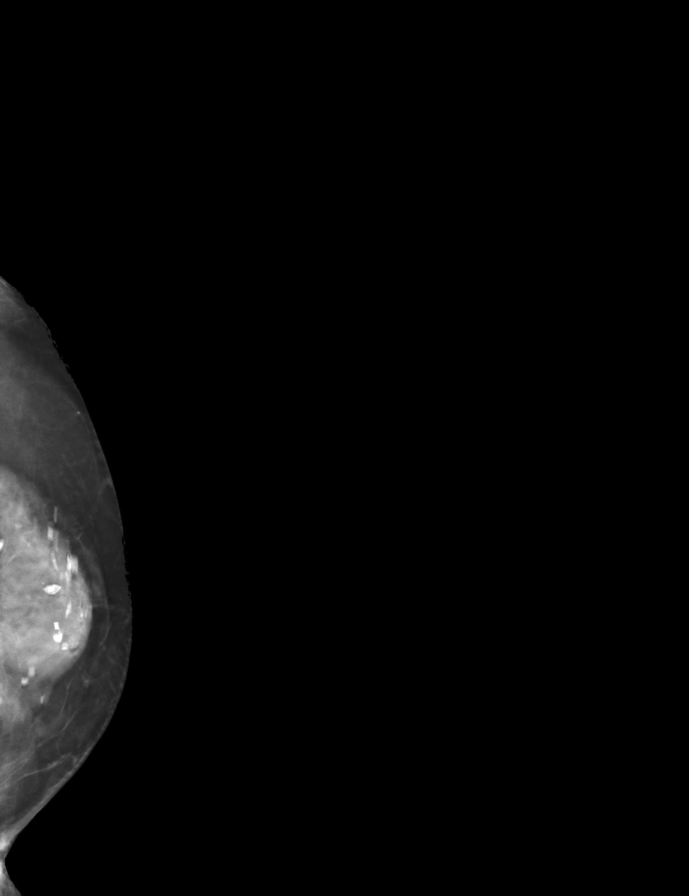

[9 of 24 positions shown; findings below may reference images not displayed]

ACR Breast Density Category c: The breast tissue is heterogeneously
dense, which may obscure small masses.
FINDINGS: There are no findings suspicious for malignancy.
IMPRESSION: No mammographic evidence of malignancy. A result letter of this
screening mammogram will be mailed directly to the patient.

RECOMMENDATION:
Screening mammogram in one year. (Code:Q3-W-BC3)

BI-RADS CATEGORY  1: Negative.

## 2022-07-30 ENCOUNTER — Other Ambulatory Visit: Payer: Self-pay | Admitting: Orthopedic Surgery

## 2022-08-13 ENCOUNTER — Encounter (HOSPITAL_BASED_OUTPATIENT_CLINIC_OR_DEPARTMENT_OTHER): Payer: Self-pay | Admitting: Orthopedic Surgery

## 2022-08-13 ENCOUNTER — Other Ambulatory Visit: Payer: Self-pay

## 2022-08-14 NOTE — Progress Notes (Signed)

## 2022-08-20 ENCOUNTER — Other Ambulatory Visit: Payer: Self-pay

## 2022-08-20 ENCOUNTER — Ambulatory Visit (HOSPITAL_BASED_OUTPATIENT_CLINIC_OR_DEPARTMENT_OTHER): Payer: Medicare HMO | Admitting: Anesthesiology

## 2022-08-20 ENCOUNTER — Ambulatory Visit (HOSPITAL_BASED_OUTPATIENT_CLINIC_OR_DEPARTMENT_OTHER): Payer: Medicare HMO

## 2022-08-20 ENCOUNTER — Ambulatory Visit (HOSPITAL_BASED_OUTPATIENT_CLINIC_OR_DEPARTMENT_OTHER)
Admission: RE | Admit: 2022-08-20 | Discharge: 2022-08-20 | Disposition: A | Discharge status: Home / Self Care | Payer: Medicare HMO | Attending: Orthopedic Surgery | Admitting: Orthopedic Surgery

## 2022-08-20 ENCOUNTER — Encounter (HOSPITAL_BASED_OUTPATIENT_CLINIC_OR_DEPARTMENT_OTHER): Payer: Self-pay | Admitting: Orthopedic Surgery

## 2022-08-20 ENCOUNTER — Encounter (HOSPITAL_BASED_OUTPATIENT_CLINIC_OR_DEPARTMENT_OTHER)
Admission: RE | Disposition: A | Discharge status: Home / Self Care | Payer: Self-pay | Source: Home / Self Care | Attending: Orthopedic Surgery

## 2022-08-20 DIAGNOSIS — I340 Nonrheumatic mitral (valve) insufficiency: Secondary | ICD-10-CM | POA: Diagnosis not present

## 2022-08-20 DIAGNOSIS — K219 Gastro-esophageal reflux disease without esophagitis: Secondary | ICD-10-CM | POA: Insufficient documentation

## 2022-08-20 DIAGNOSIS — M8580 Other specified disorders of bone density and structure, unspecified site: Secondary | ICD-10-CM | POA: Diagnosis not present

## 2022-08-20 DIAGNOSIS — Z79899 Other long term (current) drug therapy: Secondary | ICD-10-CM | POA: Diagnosis not present

## 2022-08-20 DIAGNOSIS — M19031 Primary osteoarthritis, right wrist: Secondary | ICD-10-CM

## 2022-08-20 DIAGNOSIS — Z791 Long term (current) use of non-steroidal anti-inflammatories (NSAID): Secondary | ICD-10-CM | POA: Insufficient documentation

## 2022-08-20 DIAGNOSIS — E785 Hyperlipidemia, unspecified: Secondary | ICD-10-CM | POA: Diagnosis not present

## 2022-08-20 DIAGNOSIS — M6628 Spontaneous rupture of extensor tendons, other site: Secondary | ICD-10-CM | POA: Insufficient documentation

## 2022-08-20 DIAGNOSIS — Z853 Personal history of malignant neoplasm of breast: Secondary | ICD-10-CM | POA: Insufficient documentation

## 2022-08-20 DIAGNOSIS — I700 Atherosclerosis of aorta: Secondary | ICD-10-CM | POA: Diagnosis not present

## 2022-08-20 HISTORY — DX: Nausea with vomiting, unspecified: R11.2

## 2022-08-20 HISTORY — DX: Other specified postprocedural states: Z98.890

## 2022-08-20 HISTORY — PX: ULNA OSTEOTOMY: SHX1077

## 2022-08-20 HISTORY — PX: TENDON TRANSFER: SHX6109

## 2022-08-20 SURGERY — SHORTENING, ULNA
Anesthesia: Monitor Anesthesia Care | Site: Hand | Laterality: Right

## 2022-08-20 MED ORDER — CEFAZOLIN SODIUM-DEXTROSE 2-4 GM/100ML-% IV SOLN
INTRAVENOUS | Status: AC
  Filled 2022-08-20: qty 100

## 2022-08-20 MED ORDER — OXYCODONE HCL 5 MG PO TABS: 5.0000 mg | ORAL_TABLET | Freq: Once | ORAL | Status: DC | PRN

## 2022-08-20 MED ORDER — MIDAZOLAM HCL 2 MG/2ML IJ SOLN
INTRAMUSCULAR | Status: AC
  Filled 2022-08-20: qty 2

## 2022-08-20 MED ORDER — FENTANYL CITRATE (PF) 100 MCG/2ML IJ SOLN
50.0000 ug | Freq: Once | INTRAMUSCULAR | Status: AC
  Administered 2022-08-20: 50 ug via INTRAVENOUS

## 2022-08-20 MED ORDER — ONDANSETRON HCL 4 MG/2ML IJ SOLN
INTRAMUSCULAR | Status: DC | PRN
  Administered 2022-08-20: 4 mg via INTRAVENOUS

## 2022-08-20 MED ORDER — HYDROCODONE-ACETAMINOPHEN 5-325 MG PO TABS: ORAL_TABLET | ORAL | 0 refills | Status: AC

## 2022-08-20 MED ORDER — MIDAZOLAM HCL 2 MG/2ML IJ SOLN
1.0000 mg | Freq: Once | INTRAMUSCULAR | Status: AC
  Administered 2022-08-20: 1 mg via INTRAVENOUS

## 2022-08-20 MED ORDER — FENTANYL CITRATE (PF) 100 MCG/2ML IJ SOLN: 25.0000 ug | INTRAMUSCULAR | Status: DC | PRN

## 2022-08-20 MED ORDER — FENTANYL CITRATE (PF) 100 MCG/2ML IJ SOLN
INTRAMUSCULAR | Status: AC
  Filled 2022-08-20: qty 2

## 2022-08-20 MED ORDER — OXYCODONE HCL 5 MG/5ML PO SOLN: 5.0000 mg | Freq: Once | ORAL | Status: DC | PRN

## 2022-08-20 MED ORDER — ACETAMINOPHEN 325 MG PO TABS
ORAL_TABLET | ORAL | Status: AC
  Filled 2022-08-20: qty 2

## 2022-08-20 MED ORDER — ACETAMINOPHEN 325 MG PO TABS
650.0000 mg | ORAL_TABLET | Freq: Once | ORAL | Status: AC
  Administered 2022-08-20: 650 mg via ORAL

## 2022-08-20 MED ORDER — 0.9 % SODIUM CHLORIDE (POUR BTL) OPTIME
TOPICAL | Status: DC | PRN
  Administered 2022-08-20: 200 mL

## 2022-08-20 MED ORDER — PROPOFOL 500 MG/50ML IV EMUL
INTRAVENOUS | Status: DC | PRN
  Administered 2022-08-20: 125 ug/kg/min via INTRAVENOUS

## 2022-08-20 MED ORDER — CLONIDINE HCL (ANALGESIA) 100 MCG/ML EP SOLN
EPIDURAL | Status: DC | PRN
  Administered 2022-08-20: 80 ug

## 2022-08-20 MED ORDER — CEFAZOLIN SODIUM-DEXTROSE 2-4 GM/100ML-% IV SOLN
2.0000 g | INTRAVENOUS | Status: AC
  Administered 2022-08-20: 2 g via INTRAVENOUS

## 2022-08-20 MED ORDER — ACETAMINOPHEN 500 MG PO TABS
ORAL_TABLET | ORAL | Status: AC
  Filled 2022-08-20: qty 2

## 2022-08-20 MED ORDER — ACETAMINOPHEN 500 MG PO TABS: 1000.0000 mg | ORAL_TABLET | Freq: Once | ORAL | Status: DC

## 2022-08-20 MED ORDER — BUPIVACAINE-EPINEPHRINE (PF) 0.5% -1:200000 IJ SOLN
INTRAMUSCULAR | Status: DC | PRN
  Administered 2022-08-20: 25 mL via PERINEURAL

## 2022-08-20 MED ORDER — AMISULPRIDE (ANTIEMETIC) 5 MG/2ML IV SOLN: 10.0000 mg | Freq: Once | INTRAVENOUS | Status: DC | PRN

## 2022-08-20 MED ORDER — LACTATED RINGERS IV SOLN: INTRAVENOUS | Status: DC

## 2022-08-20 SURGICAL SUPPLY — 85 items
APL PRP STRL LF DISP 70% ISPRP (MISCELLANEOUS) ×1
BAG DECANTER FOR FLEXI CONT (MISCELLANEOUS) ×1 IMPLANT
BALL CTTN LRG ABS STRL LF (GAUZE/BANDAGES/DRESSINGS)
BLADE ARTHRO LOK 4 BEAVER (BLADE) ×1 IMPLANT
BLADE EAR TYMPAN 2.5 60D BEAV (BLADE) ×1 IMPLANT
BLADE MINI RND TIP GREEN BEAV (BLADE) ×1 IMPLANT
BLADE PRESCISION 7.0X.51X18.5 (BLADE) IMPLANT
BLADE SURG 15 STRL LF DISP TIS (BLADE) ×2 IMPLANT
BLADE SURG 15 STRL SS (BLADE) ×2
BNDG CMPR 5X3 KNIT ELC UNQ LF (GAUZE/BANDAGES/DRESSINGS) ×1
BNDG CMPR 5X4 KNIT ELC UNQ LF (GAUZE/BANDAGES/DRESSINGS)
BNDG CMPR 9X4 STRL LF SNTH (GAUZE/BANDAGES/DRESSINGS) ×1
BNDG ELASTIC 3INX 5YD STR LF (GAUZE/BANDAGES/DRESSINGS) ×1 IMPLANT
BNDG ELASTIC 4INX 5YD STR LF (GAUZE/BANDAGES/DRESSINGS) IMPLANT
BNDG ESMARK 4X9 LF (GAUZE/BANDAGES/DRESSINGS) ×1 IMPLANT
BNDG GAUZE DERMACEA FLUFF 4 (GAUZE/BANDAGES/DRESSINGS) ×1 IMPLANT
BNDG GZE DERMACEA 4 6PLY (GAUZE/BANDAGES/DRESSINGS) ×1
CHLORAPREP W/TINT 26 (MISCELLANEOUS) ×1 IMPLANT
CORD BIPOLAR FORCEPS 12FT (ELECTRODE) ×1 IMPLANT
COTTONBALL LRG STERILE PKG (GAUZE/BANDAGES/DRESSINGS) IMPLANT
COVER BACK TABLE 60X90IN (DRAPES) ×1 IMPLANT
COVER MAYO STAND STRL (DRAPES) ×1 IMPLANT
CUFF TOURN SGL QUICK 18X4 (TOURNIQUET CUFF) ×1 IMPLANT
DRAPE EXTREMITY T 121X128X90 (DISPOSABLE) ×1 IMPLANT
DRAPE OEC MINIVIEW 54X84 (DRAPES) ×1 IMPLANT
DRAPE SURG 17X23 STRL (DRAPES) ×1 IMPLANT
GAUZE 4X4 16PLY ~~LOC~~+RFID DBL (SPONGE) IMPLANT
GAUZE PAD ABD 8X10 STRL (GAUZE/BANDAGES/DRESSINGS) IMPLANT
GAUZE SPONGE 4X4 12PLY STRL (GAUZE/BANDAGES/DRESSINGS) ×1 IMPLANT
GAUZE XEROFORM 1X8 LF (GAUZE/BANDAGES/DRESSINGS) ×1 IMPLANT
GLOVE BIO SURGEON STRL SZ7.5 (GLOVE) ×1 IMPLANT
GLOVE BIOGEL PI IND STRL 8 (GLOVE) ×1 IMPLANT
GLOVE BIOGEL PI IND STRL 8.5 (GLOVE) ×1 IMPLANT
GLOVE SURG ORTHO 8.0 STRL STRW (GLOVE) ×1 IMPLANT
GOWN STRL REUS W/ TWL LRG LVL3 (GOWN DISPOSABLE) ×1 IMPLANT
GOWN STRL REUS W/TWL LRG LVL3 (GOWN DISPOSABLE) ×1
GOWN STRL REUS W/TWL XL LVL3 (GOWN DISPOSABLE) ×2 IMPLANT
LOOP VASCLR MAXI BLUE 18IN ST (MISCELLANEOUS) IMPLANT
LOOP VASCULAR MAXI 18 BLUE (MISCELLANEOUS)
LOOPS VASCLR MAXI BLUE 18IN ST (MISCELLANEOUS) IMPLANT
NDL HYPO 25X1 1.5 SAFETY (NEEDLE) IMPLANT
NDL KEITH (NEEDLE) IMPLANT
NEEDLE HYPO 25X1 1.5 SAFETY (NEEDLE) IMPLANT
NEEDLE KEITH (NEEDLE) IMPLANT
NS IRRIG 1000ML POUR BTL (IV SOLUTION) ×1 IMPLANT
PACK BASIN DAY SURGERY FS (CUSTOM PROCEDURE TRAY) ×1 IMPLANT
PAD CAST 3X4 CTTN HI CHSV (CAST SUPPLIES) ×1 IMPLANT
PAD CAST 4YDX4 CTTN HI CHSV (CAST SUPPLIES) ×1 IMPLANT
PADDING CAST ABS COTTON 3X4 (CAST SUPPLIES) IMPLANT
PADDING CAST ABS COTTON 4X4 ST (CAST SUPPLIES) ×1 IMPLANT
PADDING CAST COTTON 3X4 STRL (CAST SUPPLIES) ×1
PADDING CAST COTTON 4X4 STRL (CAST SUPPLIES) ×1
SLEEVE SCD COMPRESS KNEE MED (STOCKING) ×1 IMPLANT
SLING ARM FOAM STRAP MED (SOFTGOODS) IMPLANT
SPIKE FLUID TRANSFER (MISCELLANEOUS) IMPLANT
SPLINT PLASTER CAST XFAST 3X15 (CAST SUPPLIES) IMPLANT
STOCKINETTE 4X48 STRL (DRAPES) ×1 IMPLANT
SUT CHROMIC 5 0 P 3 (SUTURE) IMPLANT
SUT ETHIBOND 3-0 V-5 (SUTURE) IMPLANT
SUT ETHILON 3 0 PS 1 (SUTURE) IMPLANT
SUT ETHILON 4 0 PS 2 18 (SUTURE) ×1 IMPLANT
SUT FIBERWIRE 3-0 18 TAPR NDL (SUTURE)
SUT FIBERWIRE 4-0 18 DIAM BLUE (SUTURE)
SUT MERSILENE 2.0 SH NDLE (SUTURE) IMPLANT
SUT MERSILENE 4 0 P 3 (SUTURE) IMPLANT
SUT PROLENE 2 0 SH DA (SUTURE) IMPLANT
SUT PROLENE 6 0 P 1 18 (SUTURE) IMPLANT
SUT SILK 2 0 PERMA HAND 18 BK (SUTURE) IMPLANT
SUT SILK 4 0 PS 2 (SUTURE) IMPLANT
SUT VIC AB 2-0 SH 27 (SUTURE)
SUT VIC AB 2-0 SH 27XBRD (SUTURE) IMPLANT
SUT VIC AB 3-0 PS1 18 (SUTURE)
SUT VIC AB 3-0 PS1 18XBRD (SUTURE) IMPLANT
SUT VIC AB 4-0 P-3 18XBRD (SUTURE) IMPLANT
SUT VIC AB 4-0 P3 18 (SUTURE)
SUT VIC AB 4-0 PS2 18 (SUTURE) ×1 IMPLANT
SUTURE FIBERWR 3-0 18 TAPR NDL (SUTURE) IMPLANT
SUTURE FIBERWR 4-0 18 DIA BLUE (SUTURE) IMPLANT
SYR 20ML LL LF (SYRINGE) ×1 IMPLANT
SYR BULB EAR ULCER 3OZ GRN STR (SYRINGE) ×1 IMPLANT
SYR CONTROL 10ML LL (SYRINGE) IMPLANT
TOWEL GREEN STERILE FF (TOWEL DISPOSABLE) ×2 IMPLANT
TUBE NG 5FR 35IN ENFIT (TUBING) IMPLANT
UNDERPAD 30X36 HEAVY ABSORB (UNDERPADS AND DIAPERS) ×1 IMPLANT
VASCULAR TIE MAXI BLUE 18IN ST (MISCELLANEOUS)

## 2022-08-20 NOTE — Op Note (Signed)
I assisted Surgeon(s) and Role:    * Betha Loa, MD - Primary    * Cindee Salt, MD - Assisting on the Procedure(s): RIGHT DISTAL ULNA RESECTION WITH STABILIZATION RIGHT EXTENSOR INDICIS PROPRIUS TO EXTENSOR DIGITORUM TENDON TRANSFER; RIGHT EXTENSOR DIGITORUM TO EXTENSOR DIGITORUM TENDON TRANSFER on 08/20/2022.  I provided assistance on this case as follows:, Set up, approach,  identification: Distal ulna, resection of the distal ulna.  Tendon transfer for stabilization and closure of the wound. Approach of the tendon for transfer of the EIP tendon to the ring and small finger, harvesting of the tendon transfer to the ring and small fingers, followed by closure of the wounds and application of dressing and splint   Electronically signed by: Cindee Salt, MD Date: 08/20/2022 Time: 2:52 PM

## 2022-08-20 NOTE — Anesthesia Postprocedure Evaluation (Signed)
Anesthesia Post Note  Patient: TONEY LIZAOLA  Procedure(s) Performed: RIGHT DISTAL ULNA RESECTION WITH STABILIZATION (Right: Hand) RIGHT EXTENSOR INDICIS PROPRIUS TO EXTENSOR DIGITORUM TENDON TRANSFER; RIGHT EXTENSOR DIGITORUM TO EXTENSOR DIGITORUM TENDON TRANSFER (Right: Hand)     Patient location during evaluation: PACU Anesthesia Type: Regional Level of consciousness: awake and alert Pain management: pain level controlled Vital Signs Assessment: post-procedure vital signs reviewed and stable Respiratory status: spontaneous breathing, nonlabored ventilation and respiratory function stable Cardiovascular status: blood pressure returned to baseline Postop Assessment: no apparent nausea or vomiting Anesthetic complications: no   No notable events documented.  Last Vitals:  Vitals:   08/20/22 1500 08/20/22 1515  BP: 124/61 125/87  Pulse: 63   Resp: 14   Temp:    SpO2: 99% 97%    Last Pain:  Vitals:   08/20/22 1515  TempSrc:   PainSc: 0-No pain   Pain Goal: Patients Stated Pain Goal: 4 (08/20/22 1050)                 Shanda Howells

## 2022-08-20 NOTE — Anesthesia Procedure Notes (Signed)
Anesthesia Regional Block: Supraclavicular block   Pre-Anesthetic Checklist: , timeout performed,  Correct Patient, Correct Site, Correct Laterality,  Correct Procedure, Correct Position, site marked,  Risks and benefits discussed,  Pre-op evaluation,  At surgeon's request and post-op pain management  Laterality: Right  Prep: Maximum Sterile Barrier Precautions used, chloraprep       Needles:  Injection technique: Single-shot  Needle Type: Echogenic Stimulator Needle     Needle Length: 9cm  Needle Gauge: 22     Additional Needles:   Procedures:,,,, ultrasound used (permanent image in chart),,    Narrative:  Start time: 08/20/2022 12:30 PM End time: 08/20/2022 12:33 PM Injection made incrementally with aspirations every 5 mL.  Performed by: Personally  Anesthesiologist: Kaylyn Layer, MD  Additional Notes: Risks, benefits, and alternative discussed. Patient gave consent for procedure. Patient prepped and draped in sterile fashion. Sedation administered, patient remains easily responsive to voice. Relevant anatomy identified with ultrasound guidance. Local anesthetic given in 5cc increments with no signs or symptoms of intravascular injection. No pain or paraesthesias with injection. Patient monitored throughout procedure with signs of LAST or immediate complications. Tolerated well. Ultrasound image placed in chart.  Amalia Greenhouse, MD

## 2022-08-20 NOTE — Anesthesia Preprocedure Evaluation (Addendum)
Anesthesia Evaluation  Patient identified by MRN, date of birth, ID band Patient awake    Reviewed: Allergy & Precautions, NPO status , Patient's Chart, lab work & pertinent test results  History of Anesthesia Complications (+) PONV and history of anesthetic complications  Airway Mallampati: II  TM Distance: >3 FB Neck ROM: Full    Dental no notable dental hx.    Pulmonary neg pulmonary ROS   Pulmonary exam normal        Cardiovascular negative cardio ROS Normal cardiovascular exam  TTE 2022:  1. Left ventricular ejection fraction, by estimation, is 65 to 70%. The left ventricle has normal function. The left ventricle has no regional wall motion abnormalities. There is mild concentric left ventricular hypertrophy. 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 Elevated left atrial pressure.   2. Right ventricular systolic function is normal. The right ventricular size is normal.   3. Left atrial size was mildly dilated.   4. The mitral valve is abnormal. There is moderate thickening of the mitral valve leaflet(s). There is moderate calcification of the mitral valve leaflet(s). Mild mitral valve regurgitation. Mild mitral stenosis. Severe mitral annular calcification.  There is mild mitral stenosis with MVA by continuity 1.5cm2, mean gradient (likely partially elevated due to high output state in addition to MS).   5. The aortic valve is tricuspid. There is mild calcification of the aortic valve. There is mild thickening of the aortic valve. Aortic valve regurgitation is not visualized. Mild to moderate aortic valve sclerosis/calcification is present, without any evidence of aortic stenosis.   6. The inferior vena cava is normal in size with greater than 50% respiratory variability, suggesting right atrial pressure of 3 mmHg.     Neuro/Psych negative neurological ROS     GI/Hepatic Neg liver ROS,GERD  ,,  Endo/Other   negative endocrine ROS    Renal/GU negative Renal ROS     Musculoskeletal  (+) Arthritis ,  RIGHT DISTAL RADIOULNAR JOINT ARTHRITIS AND EXTENSOR TENDON RUPTURE   Abdominal   Peds  Hematology negative hematology ROS (+)   Anesthesia Other Findings Day of surgery medications reviewed with patient.  Reproductive/Obstetrics                              Anesthesia Physical Anesthesia Plan  ASA: 2  Anesthesia Plan: MAC and Regional   Post-op Pain Management: Tylenol PO (pre-op)*   Induction:   PONV Risk Score and Plan: 3 and Treatment may vary due to age or medical condition, Dexamethasone, Ondansetron and Midazolam  Airway Management Planned: Natural Airway and Simple Face Mask  Additional Equipment: None  Intra-op Plan:   Post-operative Plan:   Informed Consent: I have reviewed the patients History and Physical, chart, labs and discussed the procedure including the risks, benefits and alternatives for the proposed anesthesia with the patient or authorized representative who has indicated his/her understanding and acceptance.       Plan Discussed with: CRNA  Anesthesia Plan Comments:          Anesthesia Quick Evaluation

## 2022-08-20 NOTE — H&P (Signed)
Vicki Grant is an 77 y.o. female.   Chief Complaint: extensor tendon rupture HPI: 77 yo female with right ring and small finger extensor tendon rupture after small edc to ring edc transfer.  Noted to have druj arthritis on XR.  She wishes to proceed with eip to edc and/or edc to edc tendon transfer and distal ulna resection with stabilization.  Allergies:  Allergies  Allergen Reactions   Codeine Nausea And Vomiting   Darvocet [Propoxyphene N-Acetaminophen] Nausea And Vomiting   Other     Certain general anesthesia makes her nauseated   Penicillins     Muscle weakness    Past Medical History:  Diagnosis Date   Breast cancer (HCC)    at age 77   GERD (gastroesophageal reflux disease)    Heart murmur    Hyperlipidemia    OA (osteoarthritis)    Osteopenia    Personal history of radiation therapy    PONV (postoperative nausea and vomiting)    Reflux     Past Surgical History:  Procedure Laterality Date   BREAST BIOPSY Right    benign   BREAST LUMPECTOMY  1996   left   COLONOSCOPY     ENUCLEATION     EYE SURGERY     right eye removed 2000   KNEE ARTHROSCOPY  2011   bilat, then 2022 left   PARTIAL KNEE ARTHROPLASTY     right knee- November 2022    Family History: Family History  Problem Relation Age of Onset   Esophageal cancer Father    Lung cancer Father        lung   Cancer - Lung Father    Colon cancer Neg Hx    Rectal cancer Neg Hx    Stomach cancer Neg Hx     Social History:   reports that she has never smoked. She has never used smokeless tobacco. She reports that she does not drink alcohol and does not use drugs.  Medications: Facility-Administered Medications Prior to Admission  Medication Dose Route Frequency Provider Last Rate Last Admin   0.9 %  sodium chloride infusion  500 mL Intravenous Once Charlie Pitter III, MD       Medications Prior to Admission  Medication Sig Dispense Refill   Acetaminophen (TYLENOL PO) Take by mouth as needed.      Ascorbic Acid (VITAMIN C ER PO) Take 2,000 mg by mouth.      aspirin EC 325 MG tablet Take 81 mg by mouth daily.     atorvastatin (LIPITOR) 20 MG tablet daily.     celecoxib (CELEBREX) 200 MG capsule Take 200 mg by mouth daily.     Cholecalciferol (VITAMIN D PO) Take 4,000 Units by mouth.     clindamycin (CLEOCIN) 150 MG capsule Take by mouth 3 (three) times daily.     diazepam (VALIUM) 5 MG tablet as needed.     ibuprofen (ADVIL) 600 MG tablet Take 1 tablet by mouth every 6 (six) hours as needed.     methocarbamol (ROBAXIN) 500 MG tablet as needed.     niacin (NIASPAN) 500 MG CR tablet Take 500 mg by mouth at bedtime.     Omega-3-acid Ethyl Esters (LOVAZA PO) Take 1 g by mouth 4 (four) times daily.     omeprazole (PRILOSEC) 20 MG capsule daily.     OVER THE COUNTER MEDICATION 1,650 mg daily. Calcium 1800mg      OVER THE COUNTER MEDICATION daily. Osteo Biflex  OVER THE COUNTER MEDICATION daily. Nature Made Probiotic     RESTASIS 0.05 % ophthalmic emulsion Twice daily     tobramycin-dexamethasone (TOBRADEX) ophthalmic solution      Turmeric (QC TUMERIC COMPLEX PO) Take 2,000 mg by mouth daily.     vitamin B-12 (CYANOCOBALAMIN) 1000 MCG tablet Take 2,000 mcg by mouth daily. 3000 units daily     VITAMIN E EX Place 2,000 Units inside cheek.      nitroGLYCERIN (NITRODUR - DOSED IN MG/24 HR) 0.2 mg/hr patch Use 1/4 patch daily to the affected area. 30 patch 1   traMADol (ULTRAM) 50 MG tablet TAKE 1 OR 2 TABLETS BY MOUTH EVERY 4 TO 6 HOURS AS NEEDED FOR PAIN      No results found for this or any previous visit (from the past 48 hour(s)).  No results found.    Blood pressure 90/71, pulse 70, temperature (!) 97.2 F (36.2 C), temperature source Temporal, resp. rate 17, height 5\' 1"  (1.549 m), weight 49 kg, SpO2 98 %.  General appearance: alert, cooperative, and appears stated age Head: Normocephalic, without obvious abnormality, atraumatic Neck: supple, symmetrical, trachea  midline Extremities: Intact sensation and capillary refill all digits.  +epl/fpl/io.  No wounds.  Pulses: 2+ and symmetric Skin: Skin color, texture, turgor normal. No rashes or lesions Neurologic: Grossly normal Incision/Wound: none  Assessment/Plan Right druj arthritis with extensor to ring and small finger ruptures.  Plan eip to edc vs edc to edc tendon transfer with druj resection and stabilization.  Non operative and operative treatment options have been discussed with the patient and patient wishes to proceed with operative treatment. Risks, benefits and alternatives of surgery were discussed including risks of blood loss, infection, damage to nerves/vessels/tendons/ligament/bone, failure of surgery, need for additional surgery, complication with wound healing, stiffness.  She voiced understanding of these risks and elected to proceed.    Betha Loa 08/20/2022, 11:33 AM

## 2022-08-20 NOTE — Discharge Instructions (Addendum)
[facility] Center Instructions Hand Surgery  Wound Care: Keep your hand elevated above the level of your heart.  Do not allow it to dangle by your side.  Keep the dressing dry and do not remove it unless your doctor advises you to do so.  He will usually change it at the time of your post-op visit.  Moving your fingers is advised to stimulate circulation but will depend on the site of your surgery.  If you have a splint applied, your doctor will advise you regarding movement.  Activity: Do not drive or operate machinery today.  Rest today and then you may return to your normal activity and work as indicated by your physician.  Diet:  Drink liquids today or eat a light diet.  You may resume a regular diet tomorrow.    General expectations: Pain for two to three days. Fingers may become slightly swollen.  Call your doctor if any of the following occur: Severe pain not relieved by pain medication. Elevated temperature. Dressing soaked with blood. Inability to move fingers. White or bluish color to fingers.    Post Anesthesia Home Care Instructions  Activity: Get plenty of rest for the remainder of the day. A responsible individual must stay with you for 24 hours following the procedure.  For the next 24 hours, DO NOT: -Drive a car -Advertising copywriter -Drink alcoholic beverages -Take any medication unless instructed by your physician -Make any legal decisions or sign important papers.  Meals: Start with liquid foods such as gelatin or soup. Progress to regular foods as tolerated. Avoid greasy, spicy, heavy foods. If nausea and/or vomiting occur, drink only clear liquids until the nausea and/or vomiting subsides. Call your physician if vomiting continues.  Special Instructions/Symptoms: Your throat may feel dry or sore from the anesthesia or the breathing tube placed in your throat during surgery. If this causes discomfort, gargle with warm salt water. The discomfort should disappear  within 24 hours.  If you had a scopolamine patch placed behind your ear for the management of post- operative nausea and/or vomiting:  1. The medication in the patch is effective for 72 hours, after which it should be removed.  Wrap patch in a tissue and discard in the trash. Wash hands thoroughly with soap and water. 2. You may remove the patch earlier than 72 hours if you experience unpleasant side effects which may include dry mouth, dizziness or visual disturbances. 3. Avoid touching the patch. Wash your hands with soap and water after contact with the patch.    May have Tylenol at [facility] Anesthesia Blocks  1. Numbness or the inability to move the "blocked" extremity may last from 3-48 hours after placement. The length of time depends on the medication injected and your individual response to the medication. If the numbness is not going away after 48 hours, call your surgeon.  2. The extremity that is blocked will need to be protected until the numbness is gone and the  Strength has returned. Because you cannot feel it, you will need to take extra care to avoid injury. Because it may be weak, you may have difficulty moving it or using it. You may not know what position it is in without looking at it while the block is in effect.  3. For blocks in the legs and feet, returning to weight bearing and walking needs to be done carefully. You will need to wait until the numbness is entirely gone and the strength has returned. You  should be able to move your leg and foot normally before you try and bear weight or walk. You will need someone to be with you when you first try to ensure you do not fall and possibly risk injury.  4. Bruising and tenderness at the needle site are common side effects and will resolve in a few days.  5. Persistent numbness or new problems with movement should be communicated to the surgeon or the [facility] [phone] [facility]  [phone]).

## 2022-08-20 NOTE — Transfer of Care (Signed)
Immediate Anesthesia Transfer of Care Note  Patient: Vicki Grant  Procedure(s) Performed: RIGHT DISTAL ULNA RESECTION WITH STABILIZATION (Right: Hand) RIGHT EXTENSOR INDICIS PROPRIUS TO EXTENSOR DIGITORUM TENDON TRANSFER; RIGHT EXTENSOR DIGITORUM TO EXTENSOR DIGITORUM TENDON TRANSFER (Right: Hand)  Patient Location: PACU  Anesthesia Type:MAC combined with regional for post-op pain  Level of Consciousness: drowsy and patient cooperative  Airway & Oxygen Therapy: Patient Spontanous Breathing and Patient connected to face mask oxygen  Post-op Assessment: Report given to RN and Post -op Vital signs reviewed and stable  Post vital signs: Reviewed and stable  Last Vitals:  Vitals Value Taken Time  BP    Temp    Pulse 63 08/20/22 1453  Resp    SpO2 98 % 08/20/22 1453  Vitals shown include unvalidated device data.  Last Pain:  Vitals:   08/20/22 1050  TempSrc: Temporal  PainSc: 0-No pain      Patients Stated Pain Goal: 4 (08/20/22 1050)  Complications: No notable events documented.

## 2022-08-20 NOTE — Progress Notes (Signed)
Assisted Dr. Howze with right, supraclavicular, ultrasound guided block. Side rails up, monitors on throughout procedure. See vital signs in flow sheet. Tolerated Procedure well. 

## 2022-08-20 NOTE — Op Note (Signed)
NAME: Vicki Grant MEDICAL RECORD NO: [id number] DATE OF BIRTH: September 20, 1945 FACILITY: [facility] LOCATION: [facility] PHYSICIAN: Tami Ribas, MD   OPERATIVE REPORT   DATE OF PROCEDURE: 08/20/22    PREOPERATIVE DIAGNOSIS: Right DRUJ arthritis and extensor tendon rupture   POSTOPERATIVE DIAGNOSIS: Right DRUJ arthritis and tendon rupture   PROCEDURE: 1.  Right distal ulna resection with stabilization 2.  Right EIP to EDC to ring and small finger transfer   SURGEON:  Betha Loa, M.D.   ASSISTANT: Cindee Salt, MD   ANESTHESIA:  Regional with sedation   INTRAVENOUS FLUIDS:  Per anesthesia flow sheet.   ESTIMATED BLOOD LOSS:  Minimal.   COMPLICATIONS:  None.   SPECIMENS:  none   TOURNIQUET TIME:    Total Tourniquet Time Documented: Upper Arm (Right) - 61 minutes Total: Upper Arm (Right) - 61 minutes    DISPOSITION:  Stable to PACU.   INDICATIONS: 77 year old female status post EDC to small to [facility] to ring finger transfer.  She subsequently noted inability to extend the ring and small fingers.  She wishes to proceed with distal ulna resection with EIP to [facility] tendon transfer.  Risks, benefits and alternatives of surgery were discussed including the risks of blood loss, infection, damage to nerves, vessels, tendons, ligaments, bone for surgery, need for additional surgery, complications with wound healing, continued pain, stiffness, , recurrence.  She voiced understanding of these risks and elected to proceed.  OPERATIVE COURSE:  After being identified preoperatively by myself,  the patient and I agreed on the procedure and site of the procedure.  The surgical site was marked.  Surgical consent had been signed. Preoperative IV antibiotic prophylaxis was given. She was transferred to the operating room and placed on the operating table in supine position with the right upper extremity on an arm board.  Sedation was induced by the anesthesiologist. A regional block  had been performed by anesthesia in preoperative holding.    Right upper extremity was prepped and draped in normal sterile orthopedic fashion.  A surgical pause was performed between the surgeons, anesthesia, and operating room staff and all were in agreement as to the patient, procedure, and site of procedure.  Tourniquet at the proximal aspect of the extremity was inflated to 250 mmHg after exsanguination of the arm with an Esmarch bandage.  Incision was made longitudinally over the distal ulna.  This was carried in subcutaneous tissues by spreading technique.  The ECU tendon was identified and protected.  Ortho department was entered.  The area where the arthritis had eroded into the" compartment distal was identified.  The periosteum was sharply incised and elevated.  The saw was used to cut through the bone proximal to the ulnar head.  The ulnar head was entered carefully freed up of soft tissue attachments.  It was removed.  A slip of ECU tendon was isolated and then released proximally.  It was passed into the joint capsule and then was secured into the end of the 3-0 Ethibond suture through 2 drill holes.  This was adequate to stabilize the end of the ulna.  The wound was irrigated with sterile saline.  The capsule and periosteum were repaired back over top.  Distal ulna with 4-0 Vicryl suture in a figure-of-eight fashion.  The wound was closed with 4-0 nylon in a horizontal mattress fashion.  Incision was then made over the ring and small finger extensor tendons on the dorsum of the hand in the area of  previous surgery.  This was carried in subcutaneous tissues by spreading technique.  There was significant scar formation around the previous tendon transfer.  This was all freed up to allow for traction on the tendons providing good extension of the ring and small fingers and also some of the long finger.  Made over the dorsum of the index finger at the MP joint.  This was carried in subcutaneous tissues by  spreading technique.  The EIP tendon was identified at the ulnar side of the released.  Stump of the tendon was then repaired to the ECU tendon with 4-0 Mersilene suture in figure-of-eight fashion.  Additional incision was made over the fourth dorsal compartment at the wrist.  The EIP tendon was then brought back through this.  It was then passed back through the compartment and into the dorsum of the hand at the ulnar side.  The incisions at the index finger and at the wrist were then irrigated with sterile saline and closed with 4-0 nylon horizontal mattress fashion.  The EIP tendon was then passed a Pulvertaft weave into the ring and small finger EDC tendons and secured with Ethibond suture.  The wrist was placed through tenodesis and when the wrist was flexed the fingers came out into good extension in comparison to the index and long fingers.  Repass Pulvertaft weave was performed and secured with the 3-0 Ethibond suture.  The wound was then irrigated with sterile saline and closed with 4-0 nylon in a horizontal mattress fashion.  All wounds were dressed with sterile Xeroform 4 x 4's and wrapped with a Kerlix bandage.  Volar splint was placed including the long ring and small fingers with the fingers in extension.  This was wrapped with Kerlix and Ace bandage.  The tourniquet was deflated at 61 minutes.  Fingertips were pink with brisk capillary refill after deflation of tourniquet.  The operative  drapes were broken down.  The patient was awoken from anesthesia safely.  She was transferred back to the stretcher and taken to PACU in stable condition.  I will see her back in the office in 1 week for postoperative followup.  I will give her a prescription for Norco 5/325 1-2 tabs PO q6 hours prn pain, dispense # 20.   Betha Loa, MD Electronically signed, 08/20/22

## 2022-08-21 ENCOUNTER — Encounter (HOSPITAL_BASED_OUTPATIENT_CLINIC_OR_DEPARTMENT_OTHER): Payer: Self-pay | Admitting: Orthopedic Surgery

## 2022-11-25 ENCOUNTER — Other Ambulatory Visit: Payer: Self-pay | Admitting: Internal Medicine

## 2022-11-25 DIAGNOSIS — Z1231 Encounter for screening mammogram for malignant neoplasm of breast: Secondary | ICD-10-CM

## 2023-01-11 ENCOUNTER — Ambulatory Visit
Admission: RE | Admit: 2023-01-11 | Discharge: 2023-01-11 | Disposition: A | Discharge status: Home / Self Care | Payer: Medicare HMO | Source: Ambulatory Visit | Attending: Internal Medicine | Admitting: Internal Medicine

## 2023-01-11 DIAGNOSIS — Z1231 Encounter for screening mammogram for malignant neoplasm of breast: Secondary | ICD-10-CM

## 2023-01-14 ENCOUNTER — Other Ambulatory Visit: Payer: Self-pay | Admitting: Internal Medicine

## 2023-01-14 DIAGNOSIS — R928 Other abnormal and inconclusive findings on diagnostic imaging of breast: Secondary | ICD-10-CM

## 2023-01-19 ENCOUNTER — Encounter (HOSPITAL_COMMUNITY): Payer: Self-pay

## 2023-01-19 ENCOUNTER — Emergency Department (HOSPITAL_COMMUNITY): Payer: Medicare HMO

## 2023-01-19 ENCOUNTER — Other Ambulatory Visit: Payer: Self-pay

## 2023-01-19 ENCOUNTER — Emergency Department (HOSPITAL_COMMUNITY)
Admission: EM | Admit: 2023-01-19 | Discharge: 2023-01-19 | Disposition: A | Discharge status: Home / Self Care | Payer: Medicare HMO | Attending: Emergency Medicine | Admitting: Emergency Medicine

## 2023-01-19 DIAGNOSIS — W01198A Fall on same level from slipping, tripping and stumbling with subsequent striking against other object, initial encounter: Secondary | ICD-10-CM | POA: Insufficient documentation

## 2023-01-19 DIAGNOSIS — Z8551 Personal history of malignant neoplasm of bladder: Secondary | ICD-10-CM | POA: Insufficient documentation

## 2023-01-19 DIAGNOSIS — W19XXXA Unspecified fall, initial encounter: Secondary | ICD-10-CM

## 2023-01-19 DIAGNOSIS — S01112A Laceration without foreign body of left eyelid and periocular area, initial encounter: Secondary | ICD-10-CM | POA: Diagnosis not present

## 2023-01-19 DIAGNOSIS — Z23 Encounter for immunization: Secondary | ICD-10-CM | POA: Insufficient documentation

## 2023-01-19 DIAGNOSIS — S0990XA Unspecified injury of head, initial encounter: Secondary | ICD-10-CM | POA: Diagnosis present

## 2023-01-19 DIAGNOSIS — Z7982 Long term (current) use of aspirin: Secondary | ICD-10-CM | POA: Diagnosis not present

## 2023-01-19 DIAGNOSIS — S7002XA Contusion of left hip, initial encounter: Secondary | ICD-10-CM | POA: Diagnosis not present

## 2023-01-19 MED ORDER — ACETAMINOPHEN 325 MG PO TABS
650.0000 mg | ORAL_TABLET | Freq: Once | ORAL | Status: AC
  Administered 2023-01-19: 650 mg via ORAL
  Filled 2023-01-19: qty 2

## 2023-01-19 MED ORDER — BACITRACIN ZINC 500 UNIT/GM EX OINT
TOPICAL_OINTMENT | CUTANEOUS | Status: AC
  Administered 2023-01-19: 1
  Filled 2023-01-19: qty 1.8

## 2023-01-19 MED ORDER — LIDOCAINE-EPINEPHRINE (PF) 2 %-1:200000 IJ SOLN
20.0000 mL | Freq: Once | INTRAMUSCULAR | Status: AC
  Administered 2023-01-19: 20 mL
  Filled 2023-01-19: qty 20

## 2023-01-19 MED ORDER — TETANUS-DIPHTH-ACELL PERTUSSIS 5-2.5-18.5 LF-MCG/0.5 IM SUSY
0.5000 mL | PREFILLED_SYRINGE | Freq: Once | INTRAMUSCULAR | Status: AC
  Administered 2023-01-19: 0.5 mL via INTRAMUSCULAR
  Filled 2023-01-19: qty 0.5

## 2023-01-19 MED ORDER — BACITRACIN ZINC 500 UNIT/GM EX OINT
TOPICAL_OINTMENT | Freq: Once | CUTANEOUS | Status: AC
  Administered 2023-01-19: 2 via TOPICAL
  Filled 2023-01-19: qty 1.8

## 2023-01-19 NOTE — Discharge Instructions (Addendum)
As discussed, your sutures need to come out in 5 days. Leave the dressing on your laceration for the next 24 hours. After that, you can leave the wound open to air. After the first 24 hours, you can begin to clean the wound site with soap and water to prevent crusting over the suture knots.  Do not go into pools, lakes, or oceans until the sutures are removed in order to prevent infection.   You can alternate between ibuprofen and Tylenol every 4 hours as needed pain.  Ice your hip.  Get help right away if: You have a red streak going away from your wound.

## 2023-01-19 NOTE — ED Provider Notes (Signed)
[place] EMERGENCY DEPARTMENT AT [facility] Provider Note   CSN: [id number] Arrival date & time: 01/19/23  1344     History  Chief Complaint  Patient presents with   Marletta Lor    Vicki Grant is a 77 y.o. female with a history of bladder cancer and osteoarthritis who presents the ED today after a fall.  Patient reports that she tripped over her husband's shoes earlier today and hit the left side of her face on the hardwood floors.  Denies loss of consciousness or blood thinner use.  Patient reports pain to the left hip and a laceration to her left eyebrow.  Patient thinks that her glasses hit her eyebrow, causing the cut.  She is able to ambulate independently.  Patient denies vision changes, weakness, or slurred speech.  No additional complaints or concerns at this time.    Home Medications Prior to Admission medications   Medication Sig Start Date End Date Taking? Authorizing Provider  Acetaminophen (TYLENOL PO) Take by mouth as needed.    [provider]  Ascorbic Acid (VITAMIN C ER PO) Take 2,000 mg by mouth.     [provider]  aspirin EC 325 MG tablet Take 81 mg by mouth daily.    [provider]  atorvastatin (LIPITOR) 20 MG tablet daily. 11/10/12   [provider]  celecoxib (CELEBREX) 200 MG capsule Take 200 mg by mouth daily.    [provider]  Cholecalciferol (VITAMIN D PO) Take 4,000 Units by mouth.    [provider]  clindamycin (CLEOCIN) 150 MG capsule Take by mouth 3 (three) times daily.    [provider]  diazepam (VALIUM) 5 MG tablet as needed. 06/10/13   [provider]  HYDROcodone-acetaminophen (NORCO) 5-325 MG tablet 1-2 tabs po q6 hours prn pain 08/20/22   Betha Loa, MD  ibuprofen (ADVIL) 600 MG tablet Take 1 tablet by mouth every 6 (six) hours as needed. 08/19/20   [provider]  methocarbamol (ROBAXIN) 500 MG tablet as needed. 11/11/12   [provider]   niacin (NIASPAN) 500 MG CR tablet Take 500 mg by mouth at bedtime.    [provider]  Omega-3-acid Ethyl Esters (LOVAZA PO) Take 1 g by mouth 4 (four) times daily.    [provider]  omeprazole (PRILOSEC) 20 MG capsule daily. 10/04/12   [provider]  OVER THE COUNTER MEDICATION 1,650 mg daily. Calcium 1800mg     [provider]  OVER THE COUNTER MEDICATION daily. Osteo Biflex    [provider]  OVER THE COUNTER MEDICATION daily. Nature Made Probiotic    [provider]  RESTASIS 0.05 % ophthalmic emulsion Twice daily 10/05/18   [provider]  tobramycin-dexamethasone Wallene Dales) ophthalmic solution  07/11/13   [provider]  Turmeric (QC TUMERIC COMPLEX PO) Take 2,000 mg by mouth daily.    [provider]  vitamin B-12 (CYANOCOBALAMIN) 1000 MCG tablet Take 2,000 mcg by mouth daily. 3000 units daily    [provider]  VITAMIN E EX Place 2,000 Units inside cheek.     [provider]      Allergies    Triamcinolone, Codeine, Darvocet [propoxyphene n-acetaminophen], Omega-3, Other, Penicillins, and Propoxyphene    Review of Systems   Review of Systems  Constitutional:        Fall  Musculoskeletal:        Left hip pain  All other systems reviewed and are negative.  Physical Exam Updated Vital Signs BP (!) 161/72   Pulse 78   Temp 98.1 F (36.7 C) (Oral)   Resp 16   Ht 5\' 1"  (1.549 m) Comment: Simultaneous filing. User may not have seen previous data.  Wt 56.7 kg Comment: Simultaneous filing. User may not have seen previous data.  SpO2 100%   BMI 23.62 kg/m  Physical Exam Vitals and nursing note reviewed.  Constitutional:      General: She is not in acute distress.    Appearance: Normal appearance.  HENT:     Head: Normocephalic.     Comments: 4 cm laceration to the lateral left eyebrow, not actively bleeding on initial evaluation.  Patient developed swelling and bruising  to her left upper eyelid prior to suturing the laceration.    Nose: Nose normal.     Mouth/Throat:     Mouth: Mucous membranes are moist.  Eyes:     Extraocular Movements: Extraocular movements intact.     Conjunctiva/sclera: Conjunctivae normal.     Pupils: Pupils are equal, round, and reactive to light.  Cardiovascular:     Rate and Rhythm: Normal rate and regular rhythm.     Pulses: Normal pulses.     Heart sounds: Normal heart sounds.  Pulmonary:     Effort: Pulmonary effort is normal.     Breath sounds: Normal breath sounds.  Abdominal:     Palpations: Abdomen is soft.     Tenderness: There is no abdominal tenderness.  Musculoskeletal:        General: Swelling and tenderness present. Normal range of motion.     Cervical back: Normal range of motion and neck supple. No tenderness.     Right lower leg: No edema.     Left lower leg: No edema.     Comments: Tenderness and swelling to the left lateral hip.  No tenderness to the anterior posterior hip.  Strength, sensation, and range of motion intact bilaterally extremities.  No midline tenderness to palpation of the cervical, thoracic, or lumbar spine.  Skin:    General: Skin is warm and dry.     Findings: No rash.  Neurological:     General: No focal deficit present.     Mental Status: She is alert.     Sensory: No sensory deficit.     Motor: No weakness.     Gait: Gait normal.  Psychiatric:        Mood and Affect: Mood normal.        Behavior: Behavior normal.    ED Results / Procedures / Treatments   Labs (all labs ordered are listed, but only abnormal results are displayed) Labs Reviewed - No data to display  EKG None  Radiology CT Head Wo Contrast  Result Date: 01/19/2023 CLINICAL DATA:  Head trauma, minor (Age >= 65y) fall; Neck trauma (Age >= 65y). Fall, hitting left side of face on hardwood floor. Laceration to the left eyebrow. EXAM: CT HEAD WITHOUT CONTRAST CT CERVICAL SPINE WITHOUT CONTRAST TECHNIQUE:  Multidetector CT imaging of the head and cervical spine was performed following the standard protocol without intravenous contrast. Multiplanar CT image reconstructions of the cervical spine were also generated. RADIATION DOSE REDUCTION: This exam was performed according to the departmental dose-optimization program which includes automated exposure control, adjustment of the mA and/or kV according to patient size and/or use of iterative reconstruction technique. COMPARISON:  None Available. FINDINGS: CT HEAD FINDINGS Brain: There is no evidence of an acute infarct, intracranial  hemorrhage, mass, midline shift, or extra-axial fluid collection. The ventricles and sulci are normal. Vascular: No hyperdense vessel. Skull: No acute fracture or suspicious osseous lesion. Sinuses/Orbits: Visualized paranasal sinuses and mastoid air cells are clear. Right ocular prosthesis. Other: Small left supraorbital contusion. CT CERVICAL SPINE FINDINGS Alignment: Mild reversal of the normal cervical lordosis. Grade 1 anterolisthesis of C3 on C4. Skull base and vertebrae: No acute fracture or suspicious osseous lesion. Soft tissues and spinal canal: No prevertebral fluid or swelling. No visible canal hematoma. Disc levels: Moderately advanced disc degeneration from C3-4 through C6-7. Asymmetrically severe left facet arthrosis at C3-4 with moderate to severe left neural foraminal stenosis. Bilateral facet ankylosis at C2-3. No evidence of high-grade spinal canal stenosis. Upper chest: No mass or consolidation in the included lung apices. Other: 1.3 cm left thyroid nodule for which no follow-up imaging is required. IMPRESSION: No evidence of acute intracranial abnormality or cervical spine fracture. Electronically Signed   By: Sebastian Ache M.D.   On: 01/19/2023 19:23   CT CERVICAL SPINE WO CONTRAST  Result Date: 01/19/2023 CLINICAL DATA:  Head trauma, minor (Age >= 65y) fall; Neck trauma (Age >= 65y). Fall, hitting left side of face  on hardwood floor. Laceration to the left eyebrow. EXAM: CT HEAD WITHOUT CONTRAST CT CERVICAL SPINE WITHOUT CONTRAST TECHNIQUE: Multidetector CT imaging of the head and cervical spine was performed following the standard protocol without intravenous contrast. Multiplanar CT image reconstructions of the cervical spine were also generated. RADIATION DOSE REDUCTION: This exam was performed according to the departmental dose-optimization program which includes automated exposure control, adjustment of the mA and/or kV according to patient size and/or use of iterative reconstruction technique. COMPARISON:  None Available. FINDINGS: CT HEAD FINDINGS Brain: There is no evidence of an acute infarct, intracranial hemorrhage, mass, midline shift, or extra-axial fluid collection. The ventricles and sulci are normal. Vascular: No hyperdense vessel. Skull: No acute fracture or suspicious osseous lesion. Sinuses/Orbits: Visualized paranasal sinuses and mastoid air cells are clear. Right ocular prosthesis. Other: Small left supraorbital contusion. CT CERVICAL SPINE FINDINGS Alignment: Mild reversal of the normal cervical lordosis. Grade 1 anterolisthesis of C3 on C4. Skull base and vertebrae: No acute fracture or suspicious osseous lesion. Soft tissues and spinal canal: No prevertebral fluid or swelling. No visible canal hematoma. Disc levels: Moderately advanced disc degeneration from C3-4 through C6-7. Asymmetrically severe left facet arthrosis at C3-4 with moderate to severe left neural foraminal stenosis. Bilateral facet ankylosis at C2-3. No evidence of high-grade spinal canal stenosis. Upper chest: No mass or consolidation in the included lung apices. Other: 1.3 cm left thyroid nodule for which no follow-up imaging is required. IMPRESSION: No evidence of acute intracranial abnormality or cervical spine fracture. Electronically Signed   By: Sebastian Ache M.D.   On: 01/19/2023 19:23   DG Hip Unilat W or Wo Pelvis 2-3 Views  Left  Result Date: 01/19/2023 CLINICAL DATA:  fall.  Left lateral hip soreness. EXAM: DG HIP (WITH OR WITHOUT PELVIS) 2-3V LEFT COMPARISON:  None Available. FINDINGS: Pelvis is intact with normal and symmetric sacroiliac joints. No acute fracture or dislocation. No aggressive osseous lesion. Visualized sacral arcuate lines are unremarkable. Unremarkable symphysis pubis. There are mild degenerative changes of bilateral hip joints without significant joint space narrowing. Osteophytosis of the superior acetabulum. No radiopaque foreign bodies. 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 IMPRESSION: *No acute osseous abnormality of the pelvis or left hip joint. Electronically Signed  By: Jules Schick M.D.   On: 01/19/2023 16:27    Procedures .Marland KitchenLaceration Repair  Date/Time: 01/19/2023 7:20 PM  Performed by: Maxwell Marion, PA-C Authorized by: Maxwell Marion, PA-C   Consent:    Consent obtained:  Verbal   Consent given by:  Patient Universal protocol:    Patient identity confirmed:  Verbally with patient Anesthesia:    Anesthesia method:  Local infiltration   Local anesthetic:  Lidocaine 1% WITH epi Laceration details:    Location:  Face   Face location:  L eyebrow   Length (cm):  4 Exploration:    Hemostasis achieved with:  Epinephrine Treatment:    Area cleansed with:  Saline   Amount of cleaning:  Standard   Irrigation solution:  Sterile saline   Irrigation method:  Syringe Skin repair:    Repair method:  Sutures   Suture size:  6-0   Suture material:  Prolene   Suture technique:  Simple interrupted   Number of sutures:  5 Approximation:    Approximation:  Close Repair type:    Repair type:  Simple Post-procedure details:    Dressing:  Antibiotic ointment and non-adherent dressing   Procedure completion:  Tolerated well, no immediate complications     Medications Ordered in ED Medications   lidocaine-EPINEPHrine (XYLOCAINE W/EPI) 2 %-1:200000 (PF) injection 20 mL (20 mLs Infiltration Given by Other 01/19/23 1630)  Tdap (BOOSTRIX) injection 0.5 mL (0.5 mLs Intramuscular Given 01/19/23 1608)  acetaminophen (TYLENOL) tablet 650 mg (650 mg Oral Given 01/19/23 1604)  bacitracin 500 UNIT/GM ointment (1 Application  Given by Other 01/19/23 1656)  bacitracin ointment (2 Applications Topical Provided for home use 01/19/23 1711)    ED Course/ Medical Decision Making/ A&P                                 Medical Decision Making Amount and/or Complexity of Data Reviewed Radiology: ordered.  Risk OTC drugs. Prescription drug management.   This patient presents to the ED for concern of left hip pain, this involves an extensive number of treatment options, and is a complaint that carries with it a high risk of complications and morbidity.   Differential diagnosis includes: Fracture, dislocation, contusion, muscle strain, etc.   Comorbidities  See HPI above   Additional History  Additional history obtained from prior records.   Imaging Studies  I ordered imaging studies including CT head and neck, left hip/pelvis x-ray  I independently visualized and interpreted imaging which showed:  X-ray shows no acute fracture or dislocation. CT head and cervical spine showed no evidence of fracture or dislocation I agree with the radiologist interpretation   Problem List / ED Course / Critical Interventions / Medication Management  Left hip pain and left eyebrow laceration after fall I ordered medications including: Tylenol for pain Tdap was updated Bacitracin ointment for laceration Reevaluation of the patient after these medicines showed that the patient improved I have reviewed the patients home medicines and have made adjustments as needed   Social Determinants of Health  Housing   Test / Admission - Considered  Discussed findings with patient.  All questions were  answered. Hemodynamically stable and safe for discharge home. Return precautions given.       Final Clinical Impression(s) / ED Diagnoses Final diagnoses:  Fall, initial encounter  Injury of head, initial encounter  Contusion of left hip, initial encounter  Eyebrow laceration, left, initial encounter  Rx / DC Orders ED Discharge Orders     None         Maxwell Marion, PA-C 01/19/23 2032    Lorre Nick, MD 01/20/23 [phone]

## 2023-01-19 NOTE — ED Triage Notes (Signed)
C/o fall after tripping on shoe hitting left side of face to hardwood floor. Denies loc. Denies blood thinner usage  Laceration noted to left eyebrow.  Also c/o left hip pain  Ambulatory to triage.

## 2023-02-08 ENCOUNTER — Ambulatory Visit
Admission: RE | Admit: 2023-02-08 | Discharge: 2023-02-08 | Disposition: A | Discharge status: Home / Self Care | Payer: Medicare HMO | Source: Ambulatory Visit | Attending: Internal Medicine | Admitting: Internal Medicine

## 2023-02-08 ENCOUNTER — Ambulatory Visit: Payer: Medicare HMO

## 2023-02-08 DIAGNOSIS — R928 Other abnormal and inconclusive findings on diagnostic imaging of breast: Secondary | ICD-10-CM

## 2023-11-29 ENCOUNTER — Other Ambulatory Visit: Payer: Self-pay | Admitting: Internal Medicine

## 2023-11-29 DIAGNOSIS — Z1231 Encounter for screening mammogram for malignant neoplasm of breast: Secondary | ICD-10-CM

## 2023-12-02 ENCOUNTER — Other Ambulatory Visit: Payer: Self-pay | Admitting: Internal Medicine

## 2023-12-02 DIAGNOSIS — N644 Mastodynia: Secondary | ICD-10-CM

## 2023-12-09 ENCOUNTER — Ambulatory Visit
Admission: RE | Admit: 2023-12-09 | Discharge: 2023-12-09 | Disposition: A | Discharge status: Home / Self Care | Source: Ambulatory Visit | Attending: Internal Medicine | Admitting: Internal Medicine

## 2023-12-09 DIAGNOSIS — N644 Mastodynia: Secondary | ICD-10-CM

## 2024-01-12 ENCOUNTER — Ambulatory Visit

## 2024-01-27 ENCOUNTER — Ambulatory Visit

## 2024-01-28 ENCOUNTER — Ambulatory Visit
Admission: RE | Admit: 2024-01-28 | Discharge: 2024-01-28 | Disposition: A | Source: Ambulatory Visit | Attending: Internal Medicine | Admitting: Internal Medicine

## 2024-01-28 DIAGNOSIS — Z1231 Encounter for screening mammogram for malignant neoplasm of breast: Secondary | ICD-10-CM
